# Patient Record
Sex: Female | Born: 1945 | Race: Black or African American | Hispanic: No | Marital: Married | Smoking: Never smoker
Health system: Southern US, Community
[De-identification: ages and names within clinical notes are randomized; demographics above are authoritative.]

## PROBLEM LIST (undated history)

## (undated) DIAGNOSIS — I359 Nonrheumatic aortic valve disorder, unspecified: Secondary | ICD-10-CM

## (undated) DIAGNOSIS — I10 Essential (primary) hypertension: Secondary | ICD-10-CM

## (undated) DIAGNOSIS — D649 Anemia, unspecified: Secondary | ICD-10-CM

## (undated) DIAGNOSIS — E049 Nontoxic goiter, unspecified: Secondary | ICD-10-CM

## (undated) DIAGNOSIS — E785 Hyperlipidemia, unspecified: Secondary | ICD-10-CM

## (undated) DIAGNOSIS — E7800 Pure hypercholesterolemia, unspecified: Secondary | ICD-10-CM

## (undated) HISTORY — DX: Nonrheumatic aortic valve disorder, unspecified: I35.9

## (undated) HISTORY — DX: Hyperlipidemia, unspecified: E78.5

## (undated) HISTORY — DX: Nontoxic goiter, unspecified: E04.9

## (undated) HISTORY — PX: ABDOMINAL HYSTERECTOMY: SHX81

## (undated) HISTORY — DX: Anemia, unspecified: D64.9

## (undated) HISTORY — PX: COLONOSCOPY: SHX174

## (undated) HISTORY — DX: Essential (primary) hypertension: I10

## (undated) HISTORY — PX: OTHER SURGICAL HISTORY: SHX169

---

## 1976-05-30 HISTORY — PX: THYROIDECTOMY, PARTIAL: SHX18

## 2001-02-05 ENCOUNTER — Encounter: Payer: Self-pay | Admitting: Internal Medicine

## 2001-02-05 ENCOUNTER — Ambulatory Visit (HOSPITAL_COMMUNITY): Admission: RE | Admit: 2001-02-05 | Discharge: 2001-02-05 | Payer: Self-pay | Admitting: Internal Medicine

## 2001-08-06 ENCOUNTER — Other Ambulatory Visit: Admission: RE | Admit: 2001-08-06 | Discharge: 2001-08-06 | Payer: Self-pay | Admitting: Obstetrics & Gynecology

## 2001-08-21 ENCOUNTER — Encounter: Payer: Self-pay | Admitting: Internal Medicine

## 2001-08-21 ENCOUNTER — Ambulatory Visit (HOSPITAL_COMMUNITY): Admission: RE | Admit: 2001-08-21 | Discharge: 2001-08-21 | Payer: Self-pay | Admitting: Internal Medicine

## 2001-11-19 ENCOUNTER — Ambulatory Visit (HOSPITAL_COMMUNITY): Admission: RE | Admit: 2001-11-19 | Discharge: 2001-11-19 | Payer: Self-pay | Admitting: General Surgery

## 2002-08-28 ENCOUNTER — Encounter: Payer: Self-pay | Admitting: Internal Medicine

## 2002-08-28 ENCOUNTER — Ambulatory Visit (HOSPITAL_COMMUNITY): Admission: RE | Admit: 2002-08-28 | Discharge: 2002-08-28 | Payer: Self-pay | Admitting: Internal Medicine

## 2004-10-07 ENCOUNTER — Emergency Department (HOSPITAL_COMMUNITY): Admission: EM | Admit: 2004-10-07 | Discharge: 2004-10-07 | Payer: Self-pay | Admitting: Emergency Medicine

## 2006-10-17 ENCOUNTER — Ambulatory Visit (HOSPITAL_COMMUNITY): Admission: RE | Admit: 2006-10-17 | Discharge: 2006-10-17 | Payer: Self-pay | Admitting: General Surgery

## 2007-09-18 ENCOUNTER — Ambulatory Visit (HOSPITAL_COMMUNITY): Admission: RE | Admit: 2007-09-18 | Discharge: 2007-09-18 | Payer: Self-pay | Admitting: Internal Medicine

## 2008-09-19 ENCOUNTER — Ambulatory Visit (HOSPITAL_COMMUNITY): Admission: RE | Admit: 2008-09-19 | Discharge: 2008-09-19 | Payer: Self-pay | Admitting: Internal Medicine

## 2009-09-21 ENCOUNTER — Ambulatory Visit (HOSPITAL_COMMUNITY): Admission: RE | Admit: 2009-09-21 | Discharge: 2009-09-21 | Payer: Self-pay | Admitting: Internal Medicine

## 2010-09-21 ENCOUNTER — Other Ambulatory Visit (HOSPITAL_COMMUNITY): Payer: Self-pay | Admitting: Internal Medicine

## 2010-09-21 DIAGNOSIS — Z139 Encounter for screening, unspecified: Secondary | ICD-10-CM

## 2010-09-24 ENCOUNTER — Ambulatory Visit (HOSPITAL_COMMUNITY)
Admission: RE | Admit: 2010-09-24 | Discharge: 2010-09-24 | Disposition: A | Discharge status: Home / Self Care | Payer: Medicare Other | Source: Ambulatory Visit | Attending: Internal Medicine | Admitting: Internal Medicine

## 2010-09-24 DIAGNOSIS — Z1231 Encounter for screening mammogram for malignant neoplasm of breast: Secondary | ICD-10-CM | POA: Insufficient documentation

## 2010-09-24 DIAGNOSIS — Z139 Encounter for screening, unspecified: Secondary | ICD-10-CM

## 2010-10-15 NOTE — H&P (Signed)
Anna [name], Anna [name]           ACCOUNT NO.:  [id number]   MEDICAL RECORD NO.:  [id number]          PATIENT TYPE:  AMB   LOCATION:                                FACILITY:  [facility]   PHYSICIAN:  Dalia Heading, M.D.  DATE OF BIRTH:  04/08/1946   DATE OF ADMISSION:  DATE OF DISCHARGE:  LH                              HISTORY & PHYSICAL   AGE:  65 years old.   CHIEF COMPLAINT:  Diverticulosis.   HISTORY OF PRESENT ILLNESS:  Patient is a 65 year old black female who  was referred for endoscopic evaluation.  She needs a colonoscopy for  history of diverticulosis.  She denies any abdominal pain, weight loss,  nausea, vomiting, diarrhea, constipation, melena, hematochezia.  She  last had a colonoscopy is 2003.  There is no family history of colon  carcinoma.   PAST MEDICAL HISTORY:  Hypertension.   PAST SURGICAL HISTORY:  As noted above, hysterectomy, thyroidectomy.   CURRENT MEDICATIONS:  Hydrochlorothiazide, baby aspirin which she is  holding, vitamins, glucosamine.   ALLERGIES:  No known drug allergies.   REVIEW OF SYSTEMS:  Noncontributory.   PHYSICAL EXAMINATION:  GENERAL:  Patient is a well-developed, well-  nourished black female in no acute distress.  LUNGS:  Clear to auscultation with equal breath sounds bilaterally.  HEART:  Regular rate and rhythm without S3, S4 or murmurs.  ABDOMEN:  Soft, nontender, nondistended.  No hepatosplenomegaly or  masses noted.  RECTAL:  Deferred to the procedure.   IMPRESSION:  Diverticulosis.   PLAN:  The patient is scheduled for a colonoscopy on 10/17/2006.  The  risks and benefits of the procedure including bleeding and perforation  were fully explained to the patient, gave informed consent.      Dalia Heading, M.D.  Electronically Signed     MAJ/MEDQ  D:  09/21/2006  T:  09/21/2006  Job:  [id number]   cc:   Kingsley Callander. Ouida Sills, MD  Fax: [phone]

## 2011-10-05 ENCOUNTER — Other Ambulatory Visit (HOSPITAL_COMMUNITY): Payer: Self-pay | Admitting: Internal Medicine

## 2011-10-05 DIAGNOSIS — Z139 Encounter for screening, unspecified: Secondary | ICD-10-CM

## 2011-10-10 ENCOUNTER — Ambulatory Visit (HOSPITAL_COMMUNITY)
Admission: RE | Admit: 2011-10-10 | Discharge: 2011-10-10 | Disposition: A | Discharge status: Home / Self Care | Payer: Medicare Other | Source: Ambulatory Visit | Attending: Internal Medicine | Admitting: Internal Medicine

## 2011-10-10 DIAGNOSIS — Z1231 Encounter for screening mammogram for malignant neoplasm of breast: Secondary | ICD-10-CM | POA: Insufficient documentation

## 2011-10-10 DIAGNOSIS — Z139 Encounter for screening, unspecified: Secondary | ICD-10-CM

## 2011-11-15 NOTE — H&P (Signed)
  NTS SOAP Note  Vital Signs:  Vitals as of: 11/15/2011: Systolic 158: Diastolic 82: Heart Rate 62: Temp 97.66F: Height 21ft 2.25in: Weight 200Lbs 0 Ounces: BMI 36  BMI : 36.28 kg/m2  Subjective: This 33 Years 53 Months old Female presents for TCS.  Both father and sister have colon cancer.  Denies any GI complaints.  Last TCS 2008.    Review of Symptoms:  Constitutional:unremarkable   Head:unremarkable    Eyes:unremarkable   Nose/Mouth/Throat:unremarkable Cardiovascular:  unremarkable   Respiratory:unremarkable   Gastrointestinal:  unremarkable   Genitourinary:unremarkable     Musculoskeletal:unremarkable   Skin:unremarkable Hematolgic/Lymphatic:unremarkable     Allergic/Immunologic:unremarkable     Past Medical History:    Reviewed   Past Medical History  Surgical History: thyroidectomy, TCS 2008, [facility] Medical Problems:  Anemia, Heart murmur, High Blood pressure, High cholesterol Allergies: nkda Medications: HCTZ-losartin, vitamens   Social History:Reviewed  Social History  Preferred Language: English ([place]) Ethnicity: Not Hispanic / Latino Age: 27 Years 6 Months Marital Status:  M Alcohol:  No Recreational drug(s):  No   Smoking Status: Never smoker reviewed on 11/15/2011  Family History:  Reviewed   Family History              Father:  Cancer-colon             Sibling:  Cancer-colon in sister    Objective Information:   Obese BF in NAD Eyes:unremarkable   Neck:unremarkable   Heart:  RRR, no murmur appreciated, but heart soound distant. Lungs:    CTA bilaterally, no wheezes, rhonchi, rales.  Breathing unlabored. Abdomen:Soft, NT/ND, no HSM, no masses.   deferred to procedure  Assessment:Family h/o colon cancer  Diagnosis &amp; Procedure: DiagnosisCode: V16.0, ProcedureCode: 16109,    Plan:Scheduled for TCS on 11/22/11.   Patient Education:Alternative treatments  to surgery were discussed with patient (and family).  Risks and benefits  of procedure were fully explained to the patient (and family) who gave informed consent. Patient/family questions were addressed.  Follow-up:Pending Surgery

## 2011-11-16 ENCOUNTER — Encounter (HOSPITAL_COMMUNITY): Payer: Self-pay | Admitting: Pharmacy Technician

## 2011-11-22 ENCOUNTER — Encounter (HOSPITAL_COMMUNITY)
Admission: RE | Disposition: A | Discharge status: Home / Self Care | Payer: Self-pay | Source: Ambulatory Visit | Attending: General Surgery

## 2011-11-22 ENCOUNTER — Ambulatory Visit (HOSPITAL_COMMUNITY)
Admission: RE | Admit: 2011-11-22 | Discharge: 2011-11-22 | Disposition: A | Discharge status: Home / Self Care | Payer: Medicare Other | Source: Ambulatory Visit | Attending: General Surgery | Admitting: General Surgery

## 2011-11-22 ENCOUNTER — Encounter (HOSPITAL_COMMUNITY): Payer: Self-pay | Admitting: *Deleted

## 2011-11-22 DIAGNOSIS — Z79899 Other long term (current) drug therapy: Secondary | ICD-10-CM | POA: Insufficient documentation

## 2011-11-22 DIAGNOSIS — Z800 Family history of malignant neoplasm of digestive organs: Secondary | ICD-10-CM | POA: Insufficient documentation

## 2011-11-22 DIAGNOSIS — I10 Essential (primary) hypertension: Secondary | ICD-10-CM | POA: Insufficient documentation

## 2011-11-22 DIAGNOSIS — Z1211 Encounter for screening for malignant neoplasm of colon: Secondary | ICD-10-CM | POA: Insufficient documentation

## 2011-11-22 DIAGNOSIS — E7800 Pure hypercholesterolemia, unspecified: Secondary | ICD-10-CM | POA: Insufficient documentation

## 2011-11-22 HISTORY — DX: Pure hypercholesterolemia, unspecified: E78.00

## 2011-11-22 HISTORY — PX: COLONOSCOPY: SHX5424

## 2011-11-22 HISTORY — DX: Essential (primary) hypertension: I10

## 2011-11-22 SURGERY — COLONOSCOPY
Anesthesia: Moderate Sedation

## 2011-11-22 MED ORDER — SODIUM CHLORIDE 0.45 % IV SOLN
Freq: Once | INTRAVENOUS | Status: AC
  Administered 2011-11-22: 09:00:00 via INTRAVENOUS

## 2011-11-22 MED ORDER — MEPERIDINE HCL 100 MG/ML IJ SOLN
INTRAMUSCULAR | Status: AC
  Filled 2011-11-22: qty 2

## 2011-11-22 MED ORDER — SIMETHICONE 40 MG/0.6ML PO SUSP
ORAL | Status: DC | PRN
  Administered 2011-11-22: 09:00:00

## 2011-11-22 MED ORDER — MIDAZOLAM HCL 5 MG/5ML IJ SOLN
INTRAMUSCULAR | Status: AC
  Filled 2011-11-22: qty 10

## 2011-11-22 MED ORDER — MEPERIDINE HCL 25 MG/ML IJ SOLN
INTRAMUSCULAR | Status: DC | PRN
  Administered 2011-11-22: 50 mg via INTRAVENOUS

## 2011-11-22 MED ORDER — MIDAZOLAM HCL 5 MG/5ML IJ SOLN
INTRAMUSCULAR | Status: DC | PRN
  Administered 2011-11-22: 1 mg via INTRAVENOUS
  Administered 2011-11-22: 3 mg via INTRAVENOUS

## 2011-11-22 NOTE — Discharge Instructions (Signed)
Colonoscopy Care After Read the instructions outlined below and refer to this sheet in the next few weeks. These discharge instructions provide you with general information on caring for yourself after you leave the hospital. Your doctor may also give you specific instructions. While your treatment has been planned according to the most current medical practices available, unavoidable complications occasionally occur. If you have any problems or questions after discharge, call your doctor. HOME CARE INSTRUCTIONS ACTIVITY:  You may resume your regular activity, but move at a slower pace for the next 24 hours.   Take frequent rest periods for the next 24 hours.   Walking will help get rid of the air and reduce the bloated feeling in your belly (abdomen).   No driving for 24 hours (because of the medicine (anesthesia) used during the test).   You may shower.   Do not sign any important legal documents or operate any machinery for 24 hours (because of the anesthesia used during the test).  NUTRITION:  Drink plenty of fluids.   You may resume your normal diet as instructed by your doctor.   Begin with a light meal and progress to your normal diet. Heavy or fried foods are harder to digest and may make you feel sick to your stomach (nauseated).   Avoid alcoholic beverages for 24 hours or as instructed.  MEDICATIONS:  You may resume your normal medications unless your doctor tells you otherwise.  WHAT TO EXPECT TODAY:  Some feelings of bloating in the abdomen.   Passage of more gas than usual.   Spotting of blood in your stool or on the toilet paper.  IF YOU HAD POLYPS REMOVED DURING THE COLONOSCOPY:  No aspirin products for 7 days or as instructed.   No alcohol for 7 days or as instructed.   Eat a soft diet for the next 24 hours.  FINDING OUT THE RESULTS OF YOUR TEST Not all test results are available during your visit. If your test results are not back during the visit, make an  appointment with your caregiver to find out the results. Do not assume everything is normal if you have not heard from your caregiver or the medical facility. It is important for you to follow up on all of your test results.  SEEK IMMEDIATE MEDICAL CARE IF:  You have more than a spotting of blood in your stool.   Your belly is swollen (abdominal distention).   You are nauseated or vomiting.   You have a fever.   You have abdominal pain or discomfort that is severe or gets worse throughout the day.  Document Released: 12/29/2003 Document Revised: 05/05/2011 Document Reviewed: 12/27/2007 ExitCare Patient Information 2012 [employer]. 

## 2011-11-22 NOTE — Op Note (Signed)
[facility] [address] [place], [place]  [address]  COLONOSCOPY PROCEDURE REPORT  PATIENT:  Anna [name], Anna [name]  MR#:  [id number] BIRTHDATE:  05/19/1946, 65 yrs. old  GENDER:  female ENDOSCOPIST:  Franky Macho, MD REF. BY:  Carylon Perches, M.D. PROCEDURE DATE:  11/22/2011 PROCEDURE:  Higher-risk screening colonoscopy G0105  ASA CLASS:  Class II INDICATIONS:  family history of colon cancer MEDICATIONS:   Versed 4 mg IV, demerol 50 mg IV  DESCRIPTION OF PROCEDURE:   After the risks benefits and alternatives of the procedure were thoroughly explained, informed consent was obtained.  Digital rectal exam was performed and revealed no abnormalities.   The EC-[id number] ([id number]) endoscope was introduced through the anus and advanced to the cecum, which was identified by both the appendix and ileocecal valve, without limitations.  The quality of the prep was adequate..  The instrument was then slowly withdrawn as the colon was fully examined.  FINDINGS:  A normal appearing cecum, ileocecal valve, and appendiceal orifice were identified. The ascending, hepatic flexure, transverse, splenic flexure, descending, sigmoid colon, and rectum appeared unremarkable.   Retroflexed views in the rectum revealed no abnormalities.  The scope was then withdrawn from the cecum and the procedure completed. COMPLICATIONS:  None ENDOSCOPIC IMPRESSION: 1) Normal colon RECOMMENDATIONS:  REPEAT EXAM:  In 5 year(s) for Colonoscopy.  ______________________________ Franky Macho, MD  CC:  Carylon Perches, MD  n. Rosalie DoctorFranky Macho at 11/22/2011 09:26 AM  Randolm Idol, [phone]

## 2011-11-22 NOTE — Interval H&P Note (Signed)
History and Physical Interval Note:  11/22/2011 8:53 AM  Anna [name]  has presented today for surgery, with the diagnosis of Family Hx Colon Ca  The various methods of treatment have been discussed with the patient and family. After consideration of risks, benefits and other options for treatment, the patient has consented to  Procedure(s) (LRB): COLONOSCOPY (N/A) as a surgical intervention .  The patient's history has been reviewed, patient examined, no change in status, stable for surgery.  I have reviewed the patients' chart and labs.  Questions were answered to the patient's satisfaction.     Franky Macho A

## 2011-11-25 ENCOUNTER — Encounter (HOSPITAL_COMMUNITY): Payer: Self-pay | Admitting: General Surgery

## 2012-01-25 ENCOUNTER — Emergency Department (HOSPITAL_COMMUNITY)
Admission: EM | Admit: 2012-01-25 | Discharge: 2012-01-25 | Disposition: A | Discharge status: Home / Self Care | Payer: Medicare Other | Attending: Emergency Medicine | Admitting: Emergency Medicine

## 2012-01-25 ENCOUNTER — Encounter (HOSPITAL_COMMUNITY): Payer: Self-pay

## 2012-01-25 DIAGNOSIS — Y93B9 Activity, other involving muscle strengthening exercises: Secondary | ICD-10-CM | POA: Insufficient documentation

## 2012-01-25 DIAGNOSIS — Y998 Other external cause status: Secondary | ICD-10-CM | POA: Insufficient documentation

## 2012-01-25 DIAGNOSIS — S0101XA Laceration without foreign body of scalp, initial encounter: Secondary | ICD-10-CM

## 2012-01-25 DIAGNOSIS — W208XXA Other cause of strike by thrown, projected or falling object, initial encounter: Secondary | ICD-10-CM | POA: Insufficient documentation

## 2012-01-25 DIAGNOSIS — S0100XA Unspecified open wound of scalp, initial encounter: Secondary | ICD-10-CM | POA: Insufficient documentation

## 2012-01-25 DIAGNOSIS — E7800 Pure hypercholesterolemia, unspecified: Secondary | ICD-10-CM | POA: Insufficient documentation

## 2012-01-25 DIAGNOSIS — I10 Essential (primary) hypertension: Secondary | ICD-10-CM | POA: Insufficient documentation

## 2012-01-25 NOTE — ED Notes (Signed)
Pt ambulated to the bathroom.  

## 2012-01-25 NOTE — ED Notes (Signed)
Pt was working out with a punching bag and the bag fell from the ceiling.  The steel plate fell from the ceiling and hit pt in head.  No LOC.  Has hematoma to back of head and laceration.  EMS dressed but wound still ooozing.

## 2012-01-25 NOTE — ED Provider Notes (Signed)
History  This chart was scribed for Raeford Razor, MD by Shari Heritage. The patient was seen in room APA04/APA04. Patient's care was started at 1020.     CSN: [id number]  Arrival date & time 01/25/12  1020   First MD Initiated Contact with Patient 01/25/12 1031      Chief Complaint  Patient presents with  . Laceration    Patient is a 66 y.o. female presenting with skin laceration. The history is provided by the patient. No language interpreter was used.  Laceration  The incident occurred 1 to 2 hours ago. The laceration is located on the scalp. The laceration is 1 cm in size. The laceration mechanism was a a metal edge. She reports no foreign bodies present.    Anna [name] is a 66 y.o. female who presents to the Emergency Department complaining of a laceration to the left parietal region of her head onset 1-2 hours ago. Patient was struck on the back of the head with a piece of metal that fell from the ceiling while she was exercising. She denies LOC, but others told her she stumbled and fell after she was hit. No back pain. No visual disturbance. No numbness, tingling or weakness. Patient takes aspirin 81 mg daily. Patient's medical history includes HTN and hypercholesteremia. She has a surgical history of colonoscopy and abdominal hysterectomy.  Patient was transported to the ED by EMS.    Past Medical History  Diagnosis Date  . Hypertension   . Hypercholesteremia     Past Surgical History  Procedure Date  . Abdominal hysterectomy   . Goiter removed   . Colonoscopy   . Colonoscopy 11/22/2011    Procedure: COLONOSCOPY;  Surgeon: Dalia Heading, MD;  Location: [facility];  Service: Gastroenterology;  Laterality: N/A;    Family History  Problem Relation Age of Onset  . Colon cancer Father   . Colon cancer Sister     History  Substance Use Topics  . Smoking status: Never Smoker   . Smokeless tobacco: Not on file  . Alcohol Use: No    OB History    Grav Para  Term Preterm Abortions TAB SAB Ect Mult Living                  Review of Systems  Eyes: Negative for visual disturbance.  Musculoskeletal: Negative for back pain.  Skin: Positive for wound.  Neurological: Negative for syncope, weakness and numbness.  All other systems reviewed and are negative.    Allergies  Review of patient's allergies indicates no known allergies.  Home Medications   Current Outpatient Rx  Name Route Sig Dispense Refill  . VITAMIN D3 1000 UNITS PO CAPS Oral Take 1 capsule by mouth daily.    Marland Kitchen LOSARTAN POTASSIUM-HCTZ 100-25 MG PO TABS Oral Take 1 tablet by mouth daily.    . ONE-A-DAY ENERGY PO Oral Take by mouth.    Marland Kitchen NAPROXEN SODIUM 220 MG PO CAPS Oral Take 1 capsule by mouth every 8 (eight) hours as needed. Pain    . PRENATAL MULTIVITAMIN CH Oral Take 1 tablet by mouth daily.    Marland Kitchen PROPYLENE GLYCOL 0.6 % OP SOLN Ophthalmic Apply 1 drop to eye 3 (three) times daily.    . RED YEAST RICE 600 MG PO CAPS Oral Take 1 capsule by mouth daily.      BP 142/71  Pulse 78  Temp 98 F (36.7 C) (Oral)  Resp 18  Ht 5' 3.25" (1.607  m)  Wt 200 lb (90.719 kg)  BMI 35.15 kg/m2  SpO2 98%  Physical Exam  Nursing note and vitals reviewed. Constitutional: She is oriented to person, place, and time. She appears well-developed and well-nourished. No distress.  HENT:  Head: Normocephalic. Head is with laceration.       1 CM laceration to left parietal region with underlying hematoma. No active bleeding.  Eyes: Conjunctivae are normal. Right eye exhibits no discharge. Left eye exhibits no discharge.  Neck: Neck supple.  Cardiovascular: Normal rate, regular rhythm and normal heart sounds.  Exam reveals no gallop and no friction rub.   No murmur heard. Pulmonary/Chest: Effort normal and breath sounds normal. No respiratory distress.  Abdominal: Soft. She exhibits no distension. There is no tenderness.  Musculoskeletal: She exhibits no edema and no tenderness.    Neurological: She is alert and oriented to person, place, and time. No cranial nerve deficit or sensory deficit.       Cranial nerves intact. Strength is normal. Finger to nose intact.  Skin: Skin is warm and dry.  Psychiatric: She has a normal mood and affect. Her behavior is normal. Thought content normal.    ED Course  Procedures (including critical care time) DIAGNOSTIC STUDIES: Oxygen Saturation is 98% on room air, normal by my interpretation.    COORDINATION OF CARE: 10:32am- Patient understands and agrees with initial ED impression and plan with expectations set for ED visit.  LACERATION REPAIR PROCEDURE NOTE The patient's identification was confirmed and consent was obtained. This procedure was performed by Raeford Razor, MD at 10:58 AM. Site: left parietal region Anesthetic used (type and amt): Offered, but none used. Staple type/size: Stainless steel  Length:1 cm # of Staples: 1 Technique: Stapled  Complexity: Simple Site anesthetized, irrigated with NS, explored without evidence of foreign body, wound well approximated, site covered with dry, sterile dressing.  Patient tolerated procedure well without complications. Instructions for care discussed verbally and patient provided with additional written instructions for homecare and f/u.      Labs Reviewed - No data to display  No results found.   1. Scalp laceration       MDM  66 year old female for small scalp laceration. This was closed. Patient has a nonfocal neurological examination. No midline spinal tenderness. Neuroimaging was considered, but deferred without a clear indication. Continued wound care and head injury instructions were discussed. Outpatient followup as needed and for staple removal.    I personally preformed the services scribed in my presence. The recorded information has been reviewed and considered. Raeford Razor, MD.    Raeford Razor, MD 02/01/12 [phone]

## 2012-04-19 ENCOUNTER — Other Ambulatory Visit (HOSPITAL_COMMUNITY): Payer: Self-pay | Admitting: Obstetrics & Gynecology

## 2012-04-19 DIAGNOSIS — E049 Nontoxic goiter, unspecified: Secondary | ICD-10-CM

## 2012-04-24 ENCOUNTER — Ambulatory Visit (HOSPITAL_COMMUNITY)
Admission: RE | Admit: 2012-04-24 | Discharge: 2012-04-24 | Disposition: A | Discharge status: Home / Self Care | Payer: Medicare Other | Source: Ambulatory Visit | Attending: Obstetrics & Gynecology | Admitting: Obstetrics & Gynecology

## 2012-04-24 DIAGNOSIS — E049 Nontoxic goiter, unspecified: Secondary | ICD-10-CM

## 2012-10-01 ENCOUNTER — Other Ambulatory Visit (HOSPITAL_COMMUNITY): Payer: Self-pay | Admitting: Internal Medicine

## 2012-10-01 DIAGNOSIS — Z139 Encounter for screening, unspecified: Secondary | ICD-10-CM

## 2012-10-11 ENCOUNTER — Ambulatory Visit (HOSPITAL_COMMUNITY)
Admission: RE | Admit: 2012-10-11 | Discharge: 2012-10-11 | Disposition: A | Discharge status: Home / Self Care | Payer: PRIVATE HEALTH INSURANCE | Source: Ambulatory Visit | Attending: Internal Medicine | Admitting: Internal Medicine

## 2012-10-11 DIAGNOSIS — Z139 Encounter for screening, unspecified: Secondary | ICD-10-CM

## 2012-10-11 DIAGNOSIS — Z1231 Encounter for screening mammogram for malignant neoplasm of breast: Secondary | ICD-10-CM | POA: Insufficient documentation

## 2012-11-08 ENCOUNTER — Telehealth (HOSPITAL_COMMUNITY): Payer: Self-pay | Admitting: Dietician

## 2012-11-08 NOTE — Telephone Encounter (Signed)
Received referral via fax from Dr. Ouida Sills for dx: DM, HTN, and hyperlipdemia.

## 2012-11-08 NOTE — Telephone Encounter (Signed)
Called pt at 1545. Appointment scheduled for 11/03/12 at 1400.

## 2012-12-03 ENCOUNTER — Encounter (HOSPITAL_COMMUNITY): Payer: Self-pay | Admitting: Dietician

## 2012-12-03 DIAGNOSIS — E119 Type 2 diabetes mellitus without complications: Secondary | ICD-10-CM | POA: Insufficient documentation

## 2012-12-03 DIAGNOSIS — I10 Essential (primary) hypertension: Secondary | ICD-10-CM | POA: Insufficient documentation

## 2012-12-03 DIAGNOSIS — D649 Anemia, unspecified: Secondary | ICD-10-CM | POA: Insufficient documentation

## 2012-12-03 DIAGNOSIS — E785 Hyperlipidemia, unspecified: Secondary | ICD-10-CM | POA: Insufficient documentation

## 2012-12-03 DIAGNOSIS — E049 Nontoxic goiter, unspecified: Secondary | ICD-10-CM | POA: Insufficient documentation

## 2012-12-03 DIAGNOSIS — I359 Nonrheumatic aortic valve disorder, unspecified: Secondary | ICD-10-CM | POA: Insufficient documentation

## 2012-12-03 NOTE — Progress Notes (Signed)
Outpatient Initial Nutrition Assessment Date:12/03/2012   Appt Start Time: 1347  Referring Physician: Dr. Ouida Sills Reason for Visit: HTN, hyperlipidemia  Nutrition Assessment:  Height: 5' 2.25" (158.1 cm)   Weight: 197 lb (89.359 kg)   IBW: 111# %IBW: 137# UBW: 200# %UBW: 100% Body mass index is 35.75 kg/(m^2). Classified as obesity, class II. Goal Weight: 177# (10% loss of current body weight) Weight hx: Pt reports within the past 5 years she has gained approximately 30-34# ( she reports lowest weight in the past 5 years was 170# and her highest weight was 204#).   Estimated nutritional needs: 1300-1400 kcals daily, 72-90 grams protein daily, 1.3-1.4 L fluid daily  PMH:  Past Medical History  Diagnosis Date  . Hypertension   . Hypercholesteremia   . Anemia   . Aortic valve disorder   . Diabetes   . Hyperlipidemia   . HTN (hypertension)   . Nodular goiter     Medications:  Current Outpatient Rx  Name  Route  Sig  Dispense  Refill  . Cholecalciferol (VITAMIN D3) 1000 UNITS CAPS   Oral   Take 1 capsule by mouth daily.         Marland Kitchen losartan-hydrochlorothiazide (HYZAAR) 100-25 MG per tablet   Oral   Take 1 tablet by mouth daily.         . Multiple Vitamins-Minerals (ONE-A-DAY ENERGY PO)   Oral   Take 1 tablet by mouth daily.          . naproxen sodium (ALEVE) 220 MG tablet   Oral   Take 220 mg by mouth 2 (two) times daily as needed. Pain         . pneumococcal 23 valent vaccine (PNEUMOVAX 23) 25 MCG/0.5ML injection   Intramuscular   Inject 0.5 mLs into the muscle once.         . Prenatal Vit-Fe Fumarate-FA (PRENATAL MULTIVITAMIN) TABS   Oral   Take 1 tablet by mouth daily.         Marland Kitchen Propylene Glycol (SYSTANE BALANCE) 0.6 % SOLN   Both Eyes   Place 1 drop into both eyes 3 (three) times daily.          . Red Yeast Rice 600 MG CAPS   Oral   Take 2 capsules by mouth daily.          Marland Kitchen Zoster Vaccine Live (ZOSTAVAX [place])   Subcutaneous   Inject into the  skin.           Labs: CMP  No results found for this basename: na, k, cl, co2, glucose, bun, creatinine, calcium, prot, albumin, ast, alt, alkphos, bilitot, gfrnonaa, gfraa    Lipid Panel  No results found for this basename: chol, trig, hdl, cholhdl, vldl, ldlcalc     No results found for this basename: HGBA1C   No results found for this basename: GLUF, MICROALBUR, LDLCALC, CREATININE    Most recent labs (11/02/12): Na: 134, K: 4.0, Co2: 98, Cl: 30, BUN: 9, Creat: 1.00, Glucose: 118, Total Cholesterol: 195, Triglycerides: 71, HDL: 41, LDL: 140  Lifestyle/ social habits: Ms. Gambino lives in [place] with her husband of 47 years. They have no children. She retired in 2008; she was formerly a Writer at [employer]. She reports her stress level as a 6/10, citing finances and family as her main sources of stress. She reports that she has been attending a Silver Sneakers Class at a [employer] two times per week (classes are 60 minutes). She also  reports that she will walk 3 times per week. She tells me that despite being on vacation a few weeks ago, she was able to walk 3 miles per day, that she tracked with her pedometer.   Nutrition hx/habits: Ms. Straub reports no recent changes in her diet. She reports frustration that "all the foods I like to eat are bad; even the healthy ones". She reports that she is frustrated with conflicting nutrition information in the media. She reports that she has started drinking water, sugar free drink mixes, and diet sodas, but "I think they're bad for me because of the aspartame". SHe is very motivated about her health and would like to lose weight. She tells me that she has attended several wellness seminars at the [facility] about healthy eating, exercise, and other healthy lifestyle topics. She does not have a set eating schedule. She does not check her blood sugars; she reports she is "only prediabetic" and does not have a prescription for a meter or  strips.  Diet recall: Breakfast (8-10 AM): Ensure OR green Bolt house smoothie; Lunch: peanut butter and jelly sandwich on wholel wheat bread OR fish (fried tilapia or grilled salon) and sweet potato OR baked meat, broccoli, and squash; Dinner: eggs, toast, [place] bacon, [place] sausage OR egg white omelet with bell peppers OR meat, starch, and vegetable  Nutrition Diagnosis: Excessive cholestreol intake r/t diet recall AEB LDL: 140.   Nutrition Intervention: Nutrition rx: 1200 kcal NAS, heart healthy diet; 3 meals per day, no snacks; baked, boiled, grilled, or broiled proteins most often; low calorie beverages only; 30 minutes physical activity daily  Education/Counseling Provided: Educated pt on principles of diabetic diet and weight management. Discussed carbohydrate metabolism in relation to diabetes.. Educated pt on plate method, portion sizes, and sources of carbohydrate. Discussed importance of regular meal pattern. Discussed importance of adding sources of whole grains to diet to improve glycemic control. Also encouraged to choose low fat dairy, lean meats, and whole fruits and vegetables more often. Discussed options of artificial sweeteners and encouraged pt to use which brand she liked best. Reviewed patient's dietary recall. Provided examples on ways to decrease sodium and fat intake in diet. Discouraged intake of processed foods and use of salt shaker. Encouraged fresh fruits and vegetables as well as whole grain sources of carbohydrates to maximize fiber intake. Discussed nutritional content of foods commonly eaten and discussed healthier alternatives. Discussed importance of compliance to prevent further complications of disease. Educated pt on importance of physical activity (goal of at least 30 minutes 5 times per week) along with a healthy diet to achieve weight loss and glycemic goals. Encouraged slow, moderate weight loss (0.5-2# weight loss per week or 7-10% of current body weight) and  adopting healthy lifestyle changes vs. obtaining a certain body type or weight. Encouraged weighing self weekly at a consistent day and time of choic . Provided "Go With The Whole Grains" and "Destination: Heart Healthy Eating". Also provided AND Nutrition Care Manual's "Heart Heart Healthy Nutrition Therapy" handout. Used TeachBack to assess understanding.   Understanding, Motivation, Ability to Follow Recommendations: Expect fair to good compliance.   Monitoring and Evaluation: Goals: 1) 1-2# weight loss per week; 2) 30 minutes physical activity daily  Recommendations: 1) For weight loss: No less than 1200 kcals daily; 2) Use measuring cups to ensure proper portions; 3) Share entrees at restaurants; 4) Low calorie beverages; 5) Be as active as possible   F/U: PRN. Pt did not desire a follow-up  appointment at this time. Provided RD contact information.   Melody Haver, RD, LDN 12/03/2012  Appt EndTime: 1535

## 2013-10-23 ENCOUNTER — Other Ambulatory Visit (HOSPITAL_COMMUNITY): Payer: Self-pay | Admitting: Internal Medicine

## 2013-10-23 DIAGNOSIS — Z139 Encounter for screening, unspecified: Secondary | ICD-10-CM

## 2013-10-24 ENCOUNTER — Ambulatory Visit (HOSPITAL_COMMUNITY)
Admission: RE | Admit: 2013-10-24 | Discharge: 2013-10-24 | Disposition: A | Discharge status: Home / Self Care | Payer: Medicare HMO | Source: Ambulatory Visit | Attending: Internal Medicine | Admitting: Internal Medicine

## 2013-10-24 DIAGNOSIS — Z803 Family history of malignant neoplasm of breast: Secondary | ICD-10-CM | POA: Insufficient documentation

## 2013-10-24 DIAGNOSIS — Z1231 Encounter for screening mammogram for malignant neoplasm of breast: Secondary | ICD-10-CM | POA: Insufficient documentation

## 2013-10-24 DIAGNOSIS — Z139 Encounter for screening, unspecified: Secondary | ICD-10-CM

## 2013-11-21 ENCOUNTER — Ambulatory Visit (INDEPENDENT_AMBULATORY_CARE_PROVIDER_SITE_OTHER): Payer: Medicare HMO | Admitting: Obstetrics & Gynecology

## 2013-11-21 ENCOUNTER — Encounter: Payer: Self-pay | Admitting: Obstetrics & Gynecology

## 2013-11-21 VITALS — BP 150/70 | Ht 62.4 in | Wt 192.0 lb

## 2013-11-21 DIAGNOSIS — Z01419 Encounter for gynecological examination (general) (routine) without abnormal findings: Secondary | ICD-10-CM

## 2013-11-21 DIAGNOSIS — Z1212 Encounter for screening for malignant neoplasm of rectum: Secondary | ICD-10-CM

## 2013-11-21 NOTE — Progress Notes (Signed)
Patient ID: Anna [name], female   DOB: Jul 01, 1945, 68 y.o.   MRN: [id number] Subjective:     Anna [name] is a 68 y.o. female here for a routine exam.  No LMP recorded. Patient has had a hysterectomy. No obstetric history on file. Birth Control Method:  na Menstrual Calendar(currently): na  Current complaints: none.   Current acute medical issues:  na   Recent Gynecologic History No LMP recorded. Patient has had a hysterectomy. Last Pap: na,   Last mammogram: 2015,  normal  Past Medical History  Diagnosis Date  . Hypertension   . Hypercholesteremia   . Anemia   . Aortic valve disorder   . Diabetes   . Hyperlipidemia   . HTN (hypertension)   . Nodular goiter     Past Surgical History  Procedure Laterality Date  . Abdominal hysterectomy    . Goiter removed    . Colonoscopy    . Colonoscopy  11/22/2011    Procedure: COLONOSCOPY;  Surgeon: Dalia HeadingMark A Jenkins, MD;  Location: [facility];  Service: Gastroenterology;  Laterality: N/A;    OB History   Grav Para Term Preterm Abortions TAB SAB Ect Mult Living                  History   Social History  . Marital Status: Married    Spouse Name: N/A    Number of Children: N/A  . Years of Education: N/A   Social History Main Topics  . Smoking status: Never Smoker   . Smokeless tobacco: None  . Alcohol Use: No  . Drug Use: No  . Sexual Activity: None   Other Topics Concern  . None   Social History Narrative  . None    Family History  Problem Relation Age of Onset  . Colon cancer Father   . Colon cancer Sister      Review of Systems  Review of Systems  Constitutional: Negative for fever, chills, weight loss, malaise/fatigue and diaphoresis.  HENT: Negative for hearing loss, ear pain, nosebleeds, congestion, sore throat, neck pain, tinnitus and ear discharge.   Eyes: Negative for blurred vision, double vision, photophobia, pain, discharge and redness.  Respiratory: Negative for cough, hemoptysis,  sputum production, shortness of breath, wheezing and stridor.   Cardiovascular: Negative for chest pain, palpitations, orthopnea, claudication, leg swelling and PND.  Gastrointestinal: negative for abdominal pain. Negative for heartburn, nausea, vomiting, diarrhea, constipation, blood in stool and melena.  Genitourinary: Negative for dysuria, urgency, frequency, hematuria and flank pain.  Musculoskeletal: Negative for myalgias, back pain, joint pain and [name].  Skin: Negative for itching and rash.  Neurological: Negative for dizziness, tingling, tremors, sensory change, speech change, focal weakness, seizures, loss of consciousness, weakness and headaches.  Endo/Heme/Allergies: Negative for environmental allergies and polydipsia. Does not bruise/bleed easily.  Psychiatric/Behavioral: Negative for depression, suicidal ideas, hallucinations, memory loss and substance abuse. The patient is not nervous/anxious and does not have insomnia.        Objective:    Physical Exam  Vitals reviewed. Constitutional: She is oriented to person, place, and time. She appears well-developed and well-nourished.  HENT:  Head: Normocephalic and atraumatic.        Right Ear: External ear normal.  Left Ear: External ear normal.  Nose: Nose normal.  Mouth/Throat: Oropharynx is clear and moist.  Eyes: Conjunctivae and EOM are normal. Pupils are equal, round, and reactive to light. Right eye exhibits no discharge. Left eye exhibits no discharge. No  scleral icterus.  Neck: Normal range of motion. Neck supple. No tracheal deviation present. No thyromegaly present.  Cardiovascular: Normal rate, regular rhythm, normal heart sounds and intact distal pulses.  Exam reveals no gallop and no friction rub.   No murmur heard. Respiratory: Effort normal and breath sounds normal. No respiratory distress. She has no wheezes. She has no rales. She exhibits no tenderness.  GI: Soft. Bowel sounds are normal. She exhibits no  distension and no mass. There is no tenderness. There is no rebound and no guarding.  Genitourinary:  Breasts no masses skin changes or nipple changes bilaterally      Vulva is normal without lesions Vagina is pink moist without discharge Cervix absent Uterus is absent Adnexa is negative Rectal    hemoccult negative, normal tone, no masses  Musculoskeletal: Normal range of motion. She exhibits no edema and no tenderness.  Neurological: She is alert and oriented to person, place, and time. She has normal reflexes. She displays normal reflexes. No cranial nerve deficit. She exhibits normal muscle tone. Coordination normal.  Skin: Skin is warm and dry. No rash noted. No erythema. No pallor.  Psychiatric: She has a normal mood and affect. Her behavior is normal. Judgment and thought content normal.       Assessment:    Healthy female exam.    Plan:    Follow up in: 2 years.

## 2014-10-10 ENCOUNTER — Other Ambulatory Visit (HOSPITAL_COMMUNITY): Payer: Self-pay | Admitting: Internal Medicine

## 2014-10-10 DIAGNOSIS — Z1231 Encounter for screening mammogram for malignant neoplasm of breast: Secondary | ICD-10-CM

## 2014-11-05 ENCOUNTER — Ambulatory Visit (HOSPITAL_COMMUNITY)
Admission: RE | Admit: 2014-11-05 | Discharge: 2014-11-05 | Disposition: A | Discharge status: Home / Self Care | Payer: PPO | Source: Ambulatory Visit | Attending: Internal Medicine | Admitting: Internal Medicine

## 2014-11-05 DIAGNOSIS — Z1231 Encounter for screening mammogram for malignant neoplasm of breast: Secondary | ICD-10-CM | POA: Diagnosis not present

## 2015-07-20 DIAGNOSIS — I10 Essential (primary) hypertension: Secondary | ICD-10-CM | POA: Diagnosis not present

## 2015-07-20 DIAGNOSIS — E119 Type 2 diabetes mellitus without complications: Secondary | ICD-10-CM | POA: Diagnosis not present

## 2015-07-20 DIAGNOSIS — Z79899 Other long term (current) drug therapy: Secondary | ICD-10-CM | POA: Diagnosis not present

## 2015-07-20 DIAGNOSIS — E785 Hyperlipidemia, unspecified: Secondary | ICD-10-CM | POA: Diagnosis not present

## 2015-07-27 DIAGNOSIS — E1122 Type 2 diabetes mellitus with diabetic chronic kidney disease: Secondary | ICD-10-CM | POA: Diagnosis not present

## 2015-07-27 DIAGNOSIS — E1129 Type 2 diabetes mellitus with other diabetic kidney complication: Secondary | ICD-10-CM | POA: Diagnosis not present

## 2015-07-27 DIAGNOSIS — Z6832 Body mass index (BMI) 32.0-32.9, adult: Secondary | ICD-10-CM | POA: Diagnosis not present

## 2015-07-27 DIAGNOSIS — I10 Essential (primary) hypertension: Secondary | ICD-10-CM | POA: Diagnosis not present

## 2015-08-10 DIAGNOSIS — H40013 Open angle with borderline findings, low risk, bilateral: Secondary | ICD-10-CM | POA: Diagnosis not present

## 2015-08-10 DIAGNOSIS — H18893 Other specified disorders of cornea, bilateral: Secondary | ICD-10-CM | POA: Diagnosis not present

## 2015-08-10 DIAGNOSIS — H40003 Preglaucoma, unspecified, bilateral: Secondary | ICD-10-CM | POA: Diagnosis not present

## 2015-08-10 DIAGNOSIS — H25013 Cortical age-related cataract, bilateral: Secondary | ICD-10-CM | POA: Diagnosis not present

## 2015-08-17 DIAGNOSIS — J189 Pneumonia, unspecified organism: Secondary | ICD-10-CM | POA: Diagnosis not present

## 2015-09-24 DIAGNOSIS — H40003 Preglaucoma, unspecified, bilateral: Secondary | ICD-10-CM | POA: Diagnosis not present

## 2015-09-24 DIAGNOSIS — H18893 Other specified disorders of cornea, bilateral: Secondary | ICD-10-CM | POA: Diagnosis not present

## 2015-09-25 ENCOUNTER — Other Ambulatory Visit (HOSPITAL_COMMUNITY): Payer: Self-pay | Admitting: Internal Medicine

## 2015-09-25 DIAGNOSIS — Z1231 Encounter for screening mammogram for malignant neoplasm of breast: Secondary | ICD-10-CM

## 2015-10-05 ENCOUNTER — Ambulatory Visit (INDEPENDENT_AMBULATORY_CARE_PROVIDER_SITE_OTHER): Payer: PPO | Admitting: Obstetrics & Gynecology

## 2015-10-05 ENCOUNTER — Encounter: Payer: Self-pay | Admitting: Obstetrics & Gynecology

## 2015-10-05 VITALS — BP 140/70 | HR 78 | Ht 61.4 in | Wt 171.0 lb

## 2015-10-05 DIAGNOSIS — Z1212 Encounter for screening for malignant neoplasm of rectum: Secondary | ICD-10-CM

## 2015-10-05 DIAGNOSIS — Z01419 Encounter for gynecological examination (general) (routine) without abnormal findings: Secondary | ICD-10-CM

## 2015-10-05 DIAGNOSIS — Z124 Encounter for screening for malignant neoplasm of cervix: Secondary | ICD-10-CM | POA: Diagnosis not present

## 2015-10-05 DIAGNOSIS — Z1211 Encounter for screening for malignant neoplasm of colon: Secondary | ICD-10-CM

## 2015-10-05 NOTE — Progress Notes (Signed)
Patient ID: Anna [name], female   DOB: 13-Oct-1945, 70 y.o.   MRN: [id number] Subjective:     Anna [name] is a 70 y.o. female here for a routine exam.  No LMP recorded. Patient has had a hysterectomy. No obstetric history on file. Birth Control Method:  Post menopausal Menstrual Calendar(currently): regular  Current complaints: none.   Current acute medical issues:  No acute   Recent Gynecologic History No LMP recorded. Patient has had a hysterectomy. Last Pap: na,   Last mammogram: 2016 ,  normal  Past Medical History  Diagnosis Date  . Hypertension   . Hypercholesteremia   . Anemia   . Aortic valve disorder   . Diabetes ([facility])   . Hyperlipidemia   . HTN (hypertension)   . Nodular goiter     Past Surgical History  Procedure Laterality Date  . Abdominal hysterectomy    . Goiter removed    . Colonoscopy    . Colonoscopy  11/22/2011    Procedure: COLONOSCOPY;  Surgeon: Dalia HeadingMark A Jenkins, MD;  Location: [facility];  Service: Gastroenterology;  Laterality: N/A;    OB History    No data available      Social History   Social History  . Marital Status: Married    Spouse Name: N/A  . Number of Children: N/A  . Years of Education: N/A   Social History Main Topics  . Smoking status: Never Smoker   . Smokeless tobacco: None  . Alcohol Use: No  . Drug Use: No  . Sexual Activity: Not Asked   Other Topics Concern  . None   Social History Narrative    Family History  Problem Relation Age of Onset  . Colon cancer Father   . Colon cancer Sister      Current outpatient prescriptions:  .  aspirin 81 MG tablet, Take 81 mg by mouth daily., Disp: , Rfl:  .  Cholecalciferol (VITAMIN D3) 1000 UNITS CAPS, Take 1 capsule by mouth daily., Disp: , Rfl:  .  losartan-hydrochlorothiazide (HYZAAR) 100-25 MG per tablet, Take 1 tablet by mouth daily., Disp: , Rfl:  .  Multiple Vitamins-Minerals (ONE-A-DAY ENERGY PO), Take 1 tablet by mouth daily. , Disp: , Rfl:  .   Propylene Glycol (SYSTANE BALANCE) 0.6 % SOLN, Place 1 drop into both eyes 3 (three) times daily. , Disp: , Rfl:  .  Red Yeast Rice 600 MG CAPS, Take 2 capsules by mouth daily. , Disp: , Rfl:   Review of Systems  Review of Systems  Constitutional: Negative for fever, chills, weight loss, malaise/fatigue and diaphoresis.  HENT: Negative for hearing loss, ear pain, nosebleeds, congestion, sore throat, neck pain, tinnitus and ear discharge.   Eyes: Negative for blurred vision, double vision, photophobia, pain, discharge and redness.  Respiratory: Negative for cough, hemoptysis, sputum production, shortness of breath, wheezing and stridor.   Cardiovascular: Negative for chest pain, palpitations, orthopnea, claudication, leg swelling and PND.  Gastrointestinal: negative for abdominal pain. Negative for heartburn, nausea, vomiting, diarrhea, constipation, blood in stool and melena.  Genitourinary: Negative for dysuria, urgency, frequency, hematuria and flank pain.  Musculoskeletal: Negative for myalgias, back pain, joint pain and falls.  Skin: Negative for itching and rash.  Neurological: Negative for dizziness, tingling, tremors, sensory change, speech change, focal weakness, seizures, loss of consciousness, weakness and headaches.  Endo/Heme/Allergies: Negative for environmental allergies and polydipsia. Does not bruise/bleed easily.  Psychiatric/Behavioral: Negative for depression, suicidal ideas, hallucinations, memory loss and substance abuse.  The patient is not nervous/anxious and does not have insomnia.        Objective:  Blood pressure 140/70, pulse 78, height 5' 1.4" (1.56 m), weight 171 lb (77.565 kg).   Physical Exam  Vitals reviewed. Constitutional: She is oriented to person, place, and time. She appears well-developed and well-nourished.  HENT:  Head: Normocephalic and atraumatic.        Right Ear: External ear normal.  Left Ear: External ear normal.  Nose: Nose normal.   Mouth/Throat: thyroid enlarged non tender, stable Eyes: Conjunctivae and EOM are normal. Pupils are equal, round, and reactive to light. Right eye exhibits no discharge. Left eye exhibits no discharge. No scleral icterus.  Neck: Normal range of motion. Neck supple. No tracheal deviation present. No thyromegaly present.  Cardiovascular: Normal rate, regular rhythm, normal heart sounds and intact distal pulses.  Exam reveals no gallop and no friction rub.   No murmur heard. Respiratory: Effort normal and breath sounds normal. No respiratory distress. She has no wheezes. She has no rales. She exhibits no tenderness.  GI: Soft. Bowel sounds are normal. She exhibits no distension and no mass. There is no tenderness. There is no rebound and no guarding.  Genitourinary:  Breasts no masses skin changes or nipple changes bilaterally      Vulva is normal without lesions Vagina is pink moist without discharge Cervix absent Uterus is absent Adnexa is negative  {Rectal    hemoccult negative, normal tone, no masses  Musculoskeletal: Normal range of motion. She exhibits no edema and no tenderness.  Neurological: She is alert and oriented to person, place, and time. She has normal reflexes. She displays normal reflexes. No cranial nerve deficit. She exhibits normal muscle tone. Coordination normal.  Skin: Skin is warm and dry. No rash noted. No erythema. No pallor.  Psychiatric: She has a normal mood and affect. Her behavior is normal. Judgment and thought content normal.       Medications Ordered at today's visit: Meds ordered this encounter  Medications  . aspirin 81 MG tablet    Sig: Take 81 mg by mouth daily.    Other orders placed at today's visit: No orders of the defined types were placed in this encounter.      Assessment:    Healthy female exam.    Plan:    Mammogram ordered. Follow up in: 1 year.

## 2015-10-07 ENCOUNTER — Ambulatory Visit (HOSPITAL_COMMUNITY): Payer: PPO

## 2015-11-09 ENCOUNTER — Ambulatory Visit (HOSPITAL_COMMUNITY)
Admission: RE | Admit: 2015-11-09 | Discharge: 2015-11-09 | Disposition: A | Discharge status: Home / Self Care | Payer: PPO | Source: Ambulatory Visit | Attending: Internal Medicine | Admitting: Internal Medicine

## 2015-11-09 DIAGNOSIS — Z1231 Encounter for screening mammogram for malignant neoplasm of breast: Secondary | ICD-10-CM | POA: Diagnosis not present

## 2015-11-23 DIAGNOSIS — E785 Hyperlipidemia, unspecified: Secondary | ICD-10-CM | POA: Diagnosis not present

## 2015-11-23 DIAGNOSIS — D649 Anemia, unspecified: Secondary | ICD-10-CM | POA: Diagnosis not present

## 2015-11-23 DIAGNOSIS — E049 Nontoxic goiter, unspecified: Secondary | ICD-10-CM | POA: Diagnosis not present

## 2015-11-23 DIAGNOSIS — I10 Essential (primary) hypertension: Secondary | ICD-10-CM | POA: Diagnosis not present

## 2015-11-23 DIAGNOSIS — E039 Hypothyroidism, unspecified: Secondary | ICD-10-CM | POA: Diagnosis not present

## 2015-11-23 DIAGNOSIS — Z79899 Other long term (current) drug therapy: Secondary | ICD-10-CM | POA: Diagnosis not present

## 2015-11-23 DIAGNOSIS — E119 Type 2 diabetes mellitus without complications: Secondary | ICD-10-CM | POA: Diagnosis not present

## 2015-11-30 DIAGNOSIS — Z6832 Body mass index (BMI) 32.0-32.9, adult: Secondary | ICD-10-CM | POA: Diagnosis not present

## 2015-11-30 DIAGNOSIS — E1129 Type 2 diabetes mellitus with other diabetic kidney complication: Secondary | ICD-10-CM | POA: Diagnosis not present

## 2015-11-30 DIAGNOSIS — N183 Chronic kidney disease, stage 3 (moderate): Secondary | ICD-10-CM | POA: Diagnosis not present

## 2015-11-30 DIAGNOSIS — Z0001 Encounter for general adult medical examination with abnormal findings: Secondary | ICD-10-CM | POA: Diagnosis not present

## 2016-04-01 DIAGNOSIS — E119 Type 2 diabetes mellitus without complications: Secondary | ICD-10-CM | POA: Diagnosis not present

## 2016-04-01 DIAGNOSIS — Z79899 Other long term (current) drug therapy: Secondary | ICD-10-CM | POA: Diagnosis not present

## 2016-04-08 DIAGNOSIS — N183 Chronic kidney disease, stage 3 (moderate): Secondary | ICD-10-CM | POA: Diagnosis not present

## 2016-04-08 DIAGNOSIS — Z23 Encounter for immunization: Secondary | ICD-10-CM | POA: Diagnosis not present

## 2016-04-08 DIAGNOSIS — I10 Essential (primary) hypertension: Secondary | ICD-10-CM | POA: Diagnosis not present

## 2016-04-08 DIAGNOSIS — E1122 Type 2 diabetes mellitus with diabetic chronic kidney disease: Secondary | ICD-10-CM | POA: Diagnosis not present

## 2016-04-19 DIAGNOSIS — M7502 Adhesive capsulitis of left shoulder: Secondary | ICD-10-CM | POA: Diagnosis not present

## 2016-04-19 DIAGNOSIS — M7582 Other shoulder lesions, left shoulder: Secondary | ICD-10-CM | POA: Diagnosis not present

## 2016-08-05 DIAGNOSIS — Z79899 Other long term (current) drug therapy: Secondary | ICD-10-CM | POA: Diagnosis not present

## 2016-08-05 DIAGNOSIS — I10 Essential (primary) hypertension: Secondary | ICD-10-CM | POA: Diagnosis not present

## 2016-08-05 DIAGNOSIS — E785 Hyperlipidemia, unspecified: Secondary | ICD-10-CM | POA: Diagnosis not present

## 2016-08-05 DIAGNOSIS — E049 Nontoxic goiter, unspecified: Secondary | ICD-10-CM | POA: Diagnosis not present

## 2016-08-05 DIAGNOSIS — E119 Type 2 diabetes mellitus without complications: Secondary | ICD-10-CM | POA: Diagnosis not present

## 2016-08-12 DIAGNOSIS — Z6832 Body mass index (BMI) 32.0-32.9, adult: Secondary | ICD-10-CM | POA: Diagnosis not present

## 2016-08-12 DIAGNOSIS — I10 Essential (primary) hypertension: Secondary | ICD-10-CM | POA: Diagnosis not present

## 2016-08-12 DIAGNOSIS — E1122 Type 2 diabetes mellitus with diabetic chronic kidney disease: Secondary | ICD-10-CM | POA: Diagnosis not present

## 2016-08-12 DIAGNOSIS — N183 Chronic kidney disease, stage 3 (moderate): Secondary | ICD-10-CM | POA: Diagnosis not present

## 2016-10-27 ENCOUNTER — Other Ambulatory Visit (HOSPITAL_COMMUNITY): Payer: Self-pay | Admitting: Internal Medicine

## 2016-10-27 DIAGNOSIS — Z1231 Encounter for screening mammogram for malignant neoplasm of breast: Secondary | ICD-10-CM

## 2016-11-02 DIAGNOSIS — M65341 Trigger finger, right ring finger: Secondary | ICD-10-CM | POA: Diagnosis not present

## 2016-11-07 ENCOUNTER — Encounter: Payer: Self-pay | Admitting: Obstetrics & Gynecology

## 2016-11-07 ENCOUNTER — Ambulatory Visit (INDEPENDENT_AMBULATORY_CARE_PROVIDER_SITE_OTHER): Payer: PPO | Admitting: Obstetrics & Gynecology

## 2016-11-07 VITALS — BP 160/82 | HR 64 | Ht 62.0 in | Wt 179.0 lb

## 2016-11-07 DIAGNOSIS — Z01419 Encounter for gynecological examination (general) (routine) without abnormal findings: Secondary | ICD-10-CM

## 2016-11-07 DIAGNOSIS — Z1212 Encounter for screening for malignant neoplasm of rectum: Secondary | ICD-10-CM | POA: Diagnosis not present

## 2016-11-07 DIAGNOSIS — Z1211 Encounter for screening for malignant neoplasm of colon: Secondary | ICD-10-CM | POA: Diagnosis not present

## 2016-11-07 DIAGNOSIS — I358 Other nonrheumatic aortic valve disorders: Secondary | ICD-10-CM

## 2016-11-07 LAB — HEMOCCULT GUIAC POC 1CARD (OFFICE): Fecal Occult Blood, POC: POSITIVE — AB

## 2016-11-07 NOTE — Progress Notes (Signed)
Subjective:     Anna [name] is a 71 y.o. female here for a routine exam.  No LMP recorded. Patient has had a hysterectomy. [id number] Birth Control Method:  hysterectomy Menstrual Calendar(currently):   Current complaints: occasional bleeding with BM, pt thinks from hemorrhoids.   Current acute medical issues:  none   Recent Gynecologic History No LMP recorded. Patient has had a hysterectomy. Last Pap: n/a Last mammogram: 11/09/2015,  normal  Past Medical History:  Diagnosis Date  . Anemia   . Aortic valve disorder   . Diabetes ([place])   . HTN (hypertension)   . Hypercholesteremia   . Hyperlipidemia   . Hypertension   . Nodular goiter     Past Surgical History:  Procedure Laterality Date  . ABDOMINAL HYSTERECTOMY    . COLONOSCOPY    . COLONOSCOPY  11/22/2011   Procedure: COLONOSCOPY;  Surgeon: Jamesetta So, MD;  Location: [facility];  Service: Gastroenterology;  Laterality: N/A;  . Goiter removed      OB History    Gravida Para Term Preterm AB Living   0 0 0 0 0 0   SAB TAB Ectopic Multiple Live Births   0 0 0 0 0      Social History   Social History  . Marital status: Married    Spouse name: N/A  . Number of children: N/A  . Years of education: N/A   Social History Main Topics  . Smoking status: Never Smoker  . Smokeless tobacco: Never Used  . Alcohol use No  . Drug use: No  . Sexual activity: Yes   Other Topics Concern  . None   Social History Narrative  . None    Family History  Problem Relation Age of Onset  . Colon cancer Father   . Colon cancer Sister      Current Outpatient Prescriptions:  .  aspirin 81 MG tablet, Take 81 mg by mouth daily., Disp: , Rfl:  .  Cholecalciferol (VITAMIN D3) 1000 UNITS CAPS, Take 1 capsule by mouth daily., Disp: , Rfl:  .  Cyanocobalamin (B-12 PO), Take 1 tablet by mouth daily., Disp: , Rfl:  .  Krill Oil Omega-3 500 MG CAPS, Take 500 mg by mouth daily., Disp: , Rfl:  .  losartan-hydrochlorothiazide  (HYZAAR) 100-25 MG per tablet, Take 1 tablet by mouth daily., Disp: , Rfl:  .  Multiple Vitamins-Minerals (ONE-A-DAY ENERGY PO), Take 1 tablet by mouth daily. , Disp: , Rfl:  .  Propylene Glycol (SYSTANE BALANCE) 0.6 % SOLN, Place 1 drop into both eyes 3 (three) times daily. , Disp: , Rfl:  .  Red Yeast Rice 600 MG CAPS, Take 2 capsules by mouth daily. , Disp: , Rfl:   Review of Systems  Review of Systems  Constitutional: Negative for fever, chills, weight loss, malaise/fatigue and diaphoresis.  HENT: Negative for hearing loss, ear pain, nosebleeds, congestion, sore throat, neck pain, tinnitus and ear discharge.   Eyes: Negative for blurred vision, double vision, photophobia, pain, discharge and redness.  Respiratory: Negative for cough, hemoptysis, sputum production, shortness of breath, wheezing and stridor.   Cardiovascular: Negative for chest pain, palpitations, orthopnea, claudication, leg swelling and PND.  Gastrointestinal: negative for abdominal pain. Negative for heartburn, nausea, vomiting, diarrhea, constipation, blood in stool and melena.  Genitourinary: Negative for dysuria, urgency, frequency, hematuria and flank pain.  Musculoskeletal: Negative for myalgias, back pain, joint pain and falls.  Skin: Negative for itching and rash.  Neurological: Negative  for dizziness, tingling, tremors, sensory change, speech change, focal weakness, seizures, loss of consciousness, weakness and headaches.  Endo/Heme/Allergies: Negative for environmental allergies and polydipsia. Does not bruise/bleed easily.  Psychiatric/Behavioral: Negative for depression, suicidal ideas, hallucinations, memory loss and substance abuse. The patient is not nervous/anxious and does not have insomnia.        Objective:  Blood pressure (!) 160/82, pulse 64, height '5\' 2"'  (1.575 m), weight 179 lb (81.2 kg).   Physical Exam  Vitals reviewed. Constitutional: She is oriented to person, place, and time. She appears  well-developed and well-nourished.  HENT:  Head: Normocephalic and atraumatic.        Right Ear: External ear normal.  Left Ear: External ear normal.  Nose: Nose normal.  Mouth/Throat: Oropharynx is clear and moist.  Eyes: Conjunctivae and EOM are normal. Pupils are equal, round, and reactive to light. Right eye exhibits no discharge. Left eye exhibits no discharge. No scleral icterus.  Neck: Normal range of motion. Neck supple. No tracheal deviation present. No thyromegaly present.  Cardiovascular: Normal rate, regular rhythm, normal heart sounds and intact distal pulses.  Exam reveals no gallop and no friction rub.   Brde II/VI SEM has history of aortic valve issues Respiratory: Effort normal and breath sounds normal. No respiratory distress. She has no wheezes. She has no rales. She exhibits no tenderness.  GI: Soft. Bowel sounds are normal. She exhibits no distension and no mass. There is no tenderness. There is no rebound and no guarding.  Genitourinary:  Breasts no masses skin changes or nipple changes bilaterally      Vulva is normal without lesions Vagina is pink moist without discharge Cervix absent Uterus is normal size shape and contour Adnexa is negative  {Rectal    hemoccult positive, normal tone, no masses + hemorrhoids, have been flaring lately Musculoskeletal: Normal range of motion. She exhibits no edema and no tenderness.  Neurological: She is alert and oriented to person, place, and time. She has normal reflexes. She displays normal reflexes. No cranial nerve deficit. She exhibits normal muscle tone. Coordination normal.  Skin: Skin is warm and dry. No rash noted. No erythema. No pallor.  Psychiatric: She has a normal mood and affect. Her behavior is normal. Judgment and thought content normal.       Medications Ordered at today's visit: Meds ordered this encounter  Medications  . Cyanocobalamin (B-12 PO)    Sig: Take 1 tablet by mouth daily.  Javier Docker Oil Omega-3  500 MG CAPS    Sig: Take 500 mg by mouth daily.    Other orders placed at today's visit: No orders of the defined types were placed in this encounter.     Assessment:    Healthy female exam.   Hemoccult +: will send home with 3 card kit, last colonoscopy 10/2011, to have colonoscopy this year Plan:    Mammogram ordered. Follow up in: 1 year. hemoccult kit for home, seeing Dr Willey Blade in 1 month and will discuss valve again     Return in about 2 years (around 11/08/2018) for yearly, with Dr Elonda Husky.

## 2016-11-07 NOTE — Addendum Note (Signed)
Addended by: Tish FredericksonLANCASTER, Alanta Scobey A on: 11/07/2016 04:51 PM   Modules accepted: Orders

## 2016-11-10 ENCOUNTER — Ambulatory Visit (HOSPITAL_COMMUNITY)
Admission: RE | Admit: 2016-11-10 | Discharge: 2016-11-10 | Disposition: A | Discharge status: Home / Self Care | Payer: PPO | Source: Ambulatory Visit | Attending: Internal Medicine | Admitting: Internal Medicine

## 2016-11-10 DIAGNOSIS — Z1231 Encounter for screening mammogram for malignant neoplasm of breast: Secondary | ICD-10-CM | POA: Insufficient documentation

## 2016-11-17 ENCOUNTER — Other Ambulatory Visit (INDEPENDENT_AMBULATORY_CARE_PROVIDER_SITE_OTHER): Payer: PPO

## 2016-11-17 DIAGNOSIS — Z1211 Encounter for screening for malignant neoplasm of colon: Secondary | ICD-10-CM | POA: Diagnosis not present

## 2016-11-17 LAB — HEMOCCULT GUIAC POC 1CARD (OFFICE)
Card #3 Fecal Occult Blood, POC: NEGATIVE
FECAL OCCULT BLD: NEGATIVE
FECAL OCCULT BLD: NEGATIVE

## 2016-11-29 DIAGNOSIS — E785 Hyperlipidemia, unspecified: Secondary | ICD-10-CM | POA: Diagnosis not present

## 2016-11-29 DIAGNOSIS — I10 Essential (primary) hypertension: Secondary | ICD-10-CM | POA: Diagnosis not present

## 2016-11-29 DIAGNOSIS — N183 Chronic kidney disease, stage 3 (moderate): Secondary | ICD-10-CM | POA: Diagnosis not present

## 2016-11-29 DIAGNOSIS — E1129 Type 2 diabetes mellitus with other diabetic kidney complication: Secondary | ICD-10-CM | POA: Diagnosis not present

## 2016-11-29 DIAGNOSIS — E049 Nontoxic goiter, unspecified: Secondary | ICD-10-CM | POA: Diagnosis not present

## 2016-11-29 DIAGNOSIS — D649 Anemia, unspecified: Secondary | ICD-10-CM | POA: Diagnosis not present

## 2016-12-08 DIAGNOSIS — N183 Chronic kidney disease, stage 3 (moderate): Secondary | ICD-10-CM | POA: Diagnosis not present

## 2016-12-08 DIAGNOSIS — R0602 Shortness of breath: Secondary | ICD-10-CM | POA: Diagnosis not present

## 2016-12-08 DIAGNOSIS — Z6831 Body mass index (BMI) 31.0-31.9, adult: Secondary | ICD-10-CM | POA: Diagnosis not present

## 2016-12-08 DIAGNOSIS — Z0001 Encounter for general adult medical examination with abnormal findings: Secondary | ICD-10-CM | POA: Diagnosis not present

## 2016-12-08 DIAGNOSIS — E1129 Type 2 diabetes mellitus with other diabetic kidney complication: Secondary | ICD-10-CM | POA: Diagnosis not present

## 2016-12-14 DIAGNOSIS — M65341 Trigger finger, right ring finger: Secondary | ICD-10-CM | POA: Insufficient documentation

## 2016-12-15 ENCOUNTER — Ambulatory Visit (HOSPITAL_COMMUNITY)
Admission: RE | Admit: 2016-12-15 | Discharge: 2016-12-15 | Disposition: A | Discharge status: Home / Self Care | Payer: PPO | Source: Ambulatory Visit | Attending: Internal Medicine | Admitting: Internal Medicine

## 2016-12-15 ENCOUNTER — Other Ambulatory Visit (HOSPITAL_COMMUNITY): Payer: Self-pay | Admitting: Internal Medicine

## 2016-12-15 DIAGNOSIS — I700 Atherosclerosis of aorta: Secondary | ICD-10-CM | POA: Diagnosis not present

## 2016-12-15 DIAGNOSIS — I517 Cardiomegaly: Secondary | ICD-10-CM | POA: Diagnosis not present

## 2016-12-15 DIAGNOSIS — R0602 Shortness of breath: Secondary | ICD-10-CM | POA: Diagnosis not present

## 2016-12-15 LAB — ECHOCARDIOGRAM COMPLETE
AOPV: 0.87 m/s
AV Area VTI index: 1.33 cm2/m2
AV Area VTI: 2.46 cm2
AV Area mean vel: 2.49 cm2
AV Mean grad: 7 mmHg
AV Peak grad: 15 mmHg
AV VEL mean LVOT/AV: 0.88
AV peak Index: 1.28
AV pk vel: 195 cm/s
AV vel: 2.56
AVAREAMEANVIN: 1.3 cm2/m2
CHL CUP AV VALUE AREA INDEX: 1.33
CHL CUP DOP CALC LVOT VTI: 37.8 cm
CHL CUP RV SYS PRESS: 26 mmHg
CHL CUP TV REG PEAK VELOCITY: 242 cm/s
DOP CAL AO MEAN VELOCITY: 123 cm/s
E/e' ratio: 9.83
EWDT: 320 ms
FS: 54 % — AB (ref 28–44)
IVS/LV PW RATIO, ED: 1.01
LA diam index: 1.82 cm/m2
LA vol index: 25.9 mL/m2
LASIZE: 35 mm
LAVOL: 49.7 mL
LAVOLA4C: 61.7 mL
LEFT ATRIUM END SYS DIAM: 35 mm
LV E/e' medial: 9.83
LV E/e'average: 9.83
LV dias vol index: 36 mL/m2
LV sys vol: 16 mL
LVDIAVOL: 69 mL (ref 46–106)
LVELAT: 8.16 cm/s
LVOT area: 2.84 cm2
LVOT diameter: 19 mm
LVOT peak grad rest: 11 mmHg
LVOT peak vel: 169 cm/s
LVOTSV: 107 mL
LVOTVTI: 0.9 cm
LVSYSVOLIN: 8 mL/m2
Lateral S' vel: 16.4 cm/s
MV Dec: 320
MV Peak grad: 3 mmHg
MV pk E vel: 80.2 m/s
MVPKAVEL: 93.9 m/s
PW: 11.6 mm — AB (ref 0.6–1.1)
RV TAPSE: 27.7 mm
Simpson's disk: 77
Stroke v: 53 ml
TDI e' lateral: 8.16
TDI e' medial: 5
TR max vel: 242 cm/s
VTI: 41.9 cm
Valve area: 2.56 cm2

## 2016-12-15 NOTE — Progress Notes (Signed)
*  PRELIMINARY RESULTS* Echocardiogram 2D Echocardiogram has been performed.  Stacey DrainWhite, Ling Flesch J 12/15/2016, 1:51 PM

## 2017-02-01 ENCOUNTER — Other Ambulatory Visit (HOSPITAL_COMMUNITY): Payer: Self-pay | Admitting: Internal Medicine

## 2017-02-01 ENCOUNTER — Ambulatory Visit (HOSPITAL_COMMUNITY)
Admission: RE | Admit: 2017-02-01 | Discharge: 2017-02-01 | Disposition: A | Discharge status: Home / Self Care | Payer: PPO | Source: Ambulatory Visit | Attending: Internal Medicine | Admitting: Internal Medicine

## 2017-02-01 DIAGNOSIS — M545 Low back pain: Secondary | ICD-10-CM | POA: Diagnosis not present

## 2017-02-01 DIAGNOSIS — I700 Atherosclerosis of aorta: Secondary | ICD-10-CM | POA: Insufficient documentation

## 2017-02-01 DIAGNOSIS — M5136 Other intervertebral disc degeneration, lumbar region: Secondary | ICD-10-CM | POA: Diagnosis not present

## 2017-02-08 ENCOUNTER — Other Ambulatory Visit (HOSPITAL_COMMUNITY): Payer: Self-pay | Admitting: Internal Medicine

## 2017-02-08 DIAGNOSIS — R202 Paresthesia of skin: Secondary | ICD-10-CM

## 2017-02-08 DIAGNOSIS — M51369 Other intervertebral disc degeneration, lumbar region without mention of lumbar back pain or lower extremity pain: Secondary | ICD-10-CM

## 2017-02-08 DIAGNOSIS — M79604 Pain in right leg: Secondary | ICD-10-CM

## 2017-02-08 DIAGNOSIS — M79605 Pain in left leg: Secondary | ICD-10-CM

## 2017-02-08 DIAGNOSIS — M5136 Other intervertebral disc degeneration, lumbar region: Secondary | ICD-10-CM

## 2017-02-14 ENCOUNTER — Ambulatory Visit (HOSPITAL_COMMUNITY)
Admission: RE | Admit: 2017-02-14 | Discharge: 2017-02-14 | Disposition: A | Discharge status: Home / Self Care | Payer: PPO | Source: Ambulatory Visit | Attending: Internal Medicine | Admitting: Internal Medicine

## 2017-02-14 DIAGNOSIS — M5136 Other intervertebral disc degeneration, lumbar region: Secondary | ICD-10-CM | POA: Diagnosis present

## 2017-02-14 DIAGNOSIS — R202 Paresthesia of skin: Secondary | ICD-10-CM

## 2017-02-14 DIAGNOSIS — M79604 Pain in right leg: Secondary | ICD-10-CM

## 2017-02-14 DIAGNOSIS — M4807 Spinal stenosis, lumbosacral region: Secondary | ICD-10-CM | POA: Insufficient documentation

## 2017-02-14 DIAGNOSIS — M545 Low back pain: Secondary | ICD-10-CM | POA: Diagnosis not present

## 2017-02-14 DIAGNOSIS — M48061 Spinal stenosis, lumbar region without neurogenic claudication: Secondary | ICD-10-CM | POA: Diagnosis not present

## 2017-02-14 DIAGNOSIS — M79605 Pain in left leg: Secondary | ICD-10-CM

## 2017-03-16 DIAGNOSIS — M48062 Spinal stenosis, lumbar region with neurogenic claudication: Secondary | ICD-10-CM | POA: Diagnosis not present

## 2017-03-27 DIAGNOSIS — M48062 Spinal stenosis, lumbar region with neurogenic claudication: Secondary | ICD-10-CM | POA: Diagnosis not present

## 2017-04-14 DIAGNOSIS — I10 Essential (primary) hypertension: Secondary | ICD-10-CM | POA: Diagnosis not present

## 2017-04-14 DIAGNOSIS — M48062 Spinal stenosis, lumbar region with neurogenic claudication: Secondary | ICD-10-CM | POA: Diagnosis not present

## 2017-04-14 DIAGNOSIS — Z6833 Body mass index (BMI) 33.0-33.9, adult: Secondary | ICD-10-CM | POA: Diagnosis not present

## 2017-05-04 DIAGNOSIS — Z79899 Other long term (current) drug therapy: Secondary | ICD-10-CM | POA: Diagnosis not present

## 2017-05-04 DIAGNOSIS — N183 Chronic kidney disease, stage 3 (moderate): Secondary | ICD-10-CM | POA: Diagnosis not present

## 2017-05-04 DIAGNOSIS — E1129 Type 2 diabetes mellitus with other diabetic kidney complication: Secondary | ICD-10-CM | POA: Diagnosis not present

## 2017-05-09 DIAGNOSIS — N183 Chronic kidney disease, stage 3 (moderate): Secondary | ICD-10-CM | POA: Diagnosis not present

## 2017-05-09 DIAGNOSIS — I10 Essential (primary) hypertension: Secondary | ICD-10-CM | POA: Diagnosis not present

## 2017-05-09 DIAGNOSIS — E1129 Type 2 diabetes mellitus with other diabetic kidney complication: Secondary | ICD-10-CM | POA: Diagnosis not present

## 2017-06-08 DIAGNOSIS — M48062 Spinal stenosis, lumbar region with neurogenic claudication: Secondary | ICD-10-CM | POA: Diagnosis not present

## 2017-07-24 DIAGNOSIS — R05 Cough: Secondary | ICD-10-CM | POA: Diagnosis not present

## 2017-07-24 DIAGNOSIS — Z6832 Body mass index (BMI) 32.0-32.9, adult: Secondary | ICD-10-CM | POA: Diagnosis not present

## 2017-08-31 DIAGNOSIS — Z6832 Body mass index (BMI) 32.0-32.9, adult: Secondary | ICD-10-CM | POA: Diagnosis not present

## 2017-08-31 DIAGNOSIS — M48062 Spinal stenosis, lumbar region with neurogenic claudication: Secondary | ICD-10-CM | POA: Diagnosis not present

## 2017-08-31 DIAGNOSIS — I10 Essential (primary) hypertension: Secondary | ICD-10-CM | POA: Diagnosis not present

## 2017-09-14 DIAGNOSIS — E1129 Type 2 diabetes mellitus with other diabetic kidney complication: Secondary | ICD-10-CM | POA: Diagnosis not present

## 2017-09-21 DIAGNOSIS — I10 Essential (primary) hypertension: Secondary | ICD-10-CM | POA: Diagnosis not present

## 2017-09-21 DIAGNOSIS — N183 Chronic kidney disease, stage 3 (moderate): Secondary | ICD-10-CM | POA: Diagnosis not present

## 2017-09-21 DIAGNOSIS — E1129 Type 2 diabetes mellitus with other diabetic kidney complication: Secondary | ICD-10-CM | POA: Diagnosis not present

## 2017-11-07 ENCOUNTER — Other Ambulatory Visit (HOSPITAL_COMMUNITY): Payer: Self-pay | Admitting: Internal Medicine

## 2017-11-07 DIAGNOSIS — Z1231 Encounter for screening mammogram for malignant neoplasm of breast: Secondary | ICD-10-CM

## 2017-11-16 ENCOUNTER — Ambulatory Visit (HOSPITAL_COMMUNITY)
Admission: RE | Admit: 2017-11-16 | Discharge: 2017-11-16 | Disposition: A | Discharge status: Home / Self Care | Payer: PPO | Source: Ambulatory Visit | Attending: Internal Medicine | Admitting: Internal Medicine

## 2017-11-16 DIAGNOSIS — Z1231 Encounter for screening mammogram for malignant neoplasm of breast: Secondary | ICD-10-CM | POA: Insufficient documentation

## 2017-12-06 DIAGNOSIS — M48062 Spinal stenosis, lumbar region with neurogenic claudication: Secondary | ICD-10-CM | POA: Diagnosis not present

## 2017-12-06 DIAGNOSIS — I10 Essential (primary) hypertension: Secondary | ICD-10-CM | POA: Diagnosis not present

## 2017-12-06 DIAGNOSIS — Z6834 Body mass index (BMI) 34.0-34.9, adult: Secondary | ICD-10-CM | POA: Diagnosis not present

## 2017-12-21 LAB — GLUCOSE, POCT (MANUAL RESULT ENTRY): POC GLUCOSE: 121 mg/dL — AB (ref 70–99)

## 2018-01-04 DIAGNOSIS — N183 Chronic kidney disease, stage 3 (moderate): Secondary | ICD-10-CM | POA: Diagnosis not present

## 2018-01-04 DIAGNOSIS — E1129 Type 2 diabetes mellitus with other diabetic kidney complication: Secondary | ICD-10-CM | POA: Diagnosis not present

## 2018-01-04 DIAGNOSIS — I10 Essential (primary) hypertension: Secondary | ICD-10-CM | POA: Diagnosis not present

## 2018-01-04 DIAGNOSIS — Z79899 Other long term (current) drug therapy: Secondary | ICD-10-CM | POA: Diagnosis not present

## 2018-01-04 DIAGNOSIS — E049 Nontoxic goiter, unspecified: Secondary | ICD-10-CM | POA: Diagnosis not present

## 2018-01-04 DIAGNOSIS — I358 Other nonrheumatic aortic valve disorders: Secondary | ICD-10-CM | POA: Diagnosis not present

## 2018-01-04 DIAGNOSIS — E119 Type 2 diabetes mellitus without complications: Secondary | ICD-10-CM | POA: Diagnosis not present

## 2018-01-11 DIAGNOSIS — E042 Nontoxic multinodular goiter: Secondary | ICD-10-CM | POA: Diagnosis not present

## 2018-01-11 DIAGNOSIS — E1122 Type 2 diabetes mellitus with diabetic chronic kidney disease: Secondary | ICD-10-CM | POA: Diagnosis not present

## 2018-01-11 DIAGNOSIS — I517 Cardiomegaly: Secondary | ICD-10-CM | POA: Diagnosis not present

## 2018-01-11 DIAGNOSIS — N183 Chronic kidney disease, stage 3 (moderate): Secondary | ICD-10-CM | POA: Diagnosis not present

## 2018-01-11 DIAGNOSIS — Z0001 Encounter for general adult medical examination with abnormal findings: Secondary | ICD-10-CM | POA: Diagnosis not present

## 2018-01-11 DIAGNOSIS — I10 Essential (primary) hypertension: Secondary | ICD-10-CM | POA: Diagnosis not present

## 2018-01-11 DIAGNOSIS — Z6833 Body mass index (BMI) 33.0-33.9, adult: Secondary | ICD-10-CM | POA: Diagnosis not present

## 2018-02-08 ENCOUNTER — Ambulatory Visit: Payer: PPO | Admitting: General Surgery

## 2018-02-15 ENCOUNTER — Ambulatory Visit (INDEPENDENT_AMBULATORY_CARE_PROVIDER_SITE_OTHER): Payer: PPO | Admitting: General Surgery

## 2018-02-15 ENCOUNTER — Encounter: Payer: Self-pay | Admitting: General Surgery

## 2018-02-15 VITALS — BP 183/78 | HR 76 | Temp 98.0°F | Resp 20 | Wt 186.0 lb

## 2018-02-15 DIAGNOSIS — Z1211 Encounter for screening for malignant neoplasm of colon: Secondary | ICD-10-CM

## 2018-02-15 MED ORDER — PEG 3350-KCL-NABCB-NACL-NASULF 236 G PO SOLR: 4000.0000 mL | Freq: Once | ORAL | 0 refills | Status: AC

## 2018-02-15 NOTE — Progress Notes (Signed)
Anna [name]; [id number]; August 09, 1945   HPI Patient is a 72 year old black female who was referred to my care by Dr. Ouida Sills for a screening colonoscopy.  She last had a colonoscopy 6 years ago.  She has a strong immediate family history of colon cancer in a sister and brother.  She denies abdominal pain, nausea, weight loss, blood per rectum.  She currently has 0 out of 10 abdominal pain. Past Medical History:  Diagnosis Date  . Anemia   . Aortic valve disorder   . Diabetes ([facility])   . HTN (hypertension)   . Hypercholesteremia   . Hyperlipidemia   . Hypertension   . Nodular goiter     Past Surgical History:  Procedure Laterality Date  . ABDOMINAL HYSTERECTOMY    . COLONOSCOPY    . COLONOSCOPY  11/22/2011   Procedure: COLONOSCOPY;  Surgeon: Dalia Heading, MD;  Location: [facility];  Service: Gastroenterology;  Laterality: N/A;  . Goiter removed      Family History  Problem Relation Age of Onset  . Colon cancer Father   . Colon cancer Sister     Current Outpatient Medications on File Prior to Visit  Medication Sig Dispense Refill  . gabapentin (NEURONTIN) 300 MG capsule Take 300 mg by mouth 2 (two) times daily.    Marland Kitchen aspirin 81 MG tablet Take 81 mg by mouth daily.    . Cholecalciferol (VITAMIN D3) 1000 UNITS CAPS Take 1 capsule by mouth daily.    . Cyanocobalamin (B-12 PO) Take 1 tablet by mouth daily.    Boris Lown Oil Omega-3 500 MG CAPS Take 500 mg by mouth daily.    Marland Kitchen losartan-hydrochlorothiazide (HYZAAR) 100-25 MG per tablet Take 1 tablet by mouth daily.    . Multiple Vitamins-Minerals (ONE-A-DAY ENERGY PO) Take 1 tablet by mouth daily.     Marland Kitchen Propylene Glycol (SYSTANE BALANCE) 0.6 % SOLN Place 1 drop into both eyes 3 (three) times daily.     . Red Yeast Rice 600 MG CAPS Take 2 capsules by mouth daily.      No current facility-administered medications on file prior to visit.     No Known Allergies  Social History   Substance and Sexual Activity  Alcohol Use No     Social History   Tobacco Use  Smoking Status Never Smoker  Smokeless Tobacco Never Used    Review of Systems  Constitutional: Negative.   HENT: Negative.   Eyes: Negative.   Respiratory: Positive for wheezing.   Cardiovascular: Negative.   Gastrointestinal: Negative.   Genitourinary: Negative.   Musculoskeletal: Positive for back pain and joint pain.  Skin: Negative.   Neurological: Negative.   Endo/Heme/Allergies: Negative.   Psychiatric/Behavioral: Negative.     Objective   Vitals:   02/15/18 1003  BP: (!) 183/78  Pulse: 76  Resp: 20  Temp: 98 F (36.7 C)    Physical Exam  Constitutional: She is oriented to person, place, and time. She appears well-developed and well-nourished. No distress.  HENT:  Head: Normocephalic and atraumatic.  Cardiovascular: Normal rate, regular rhythm and normal heart sounds. Exam reveals no gallop and no friction rub.  No murmur heard. Pulmonary/Chest: Effort normal and breath sounds normal. No stridor. No respiratory distress. She has no wheezes. She has no rales.  Abdominal: Soft. Bowel sounds are normal. She exhibits no distension and no mass. There is no tenderness. There is no guarding.  Neurological: She is alert and oriented to person, place, and time.  Skin: Skin is warm and dry.  Vitals reviewed. Dr. Alonza SmokerFagan's notes reviewed  Assessment  Need for screening colonoscopy, family history of colon cancer Plan   Patient is scheduled for a colonoscopy on 02/27/2018.  The risks and benefits of the procedure including bleeding and perforation were fully explained to who gave informed consent.  GoLYTELY prep has been prescribed.

## 2018-02-15 NOTE — Patient Instructions (Signed)
Colonoscopy, Adult A colonoscopy is an exam to look at the entire large intestine. During the exam, a lubricated, bendable tube is inserted into the anus and then passed into the rectum, colon, and other parts of the large intestine. A colonoscopy is often done as a part of normal colorectal screening or in response to certain symptoms, such as anemia, persistent diarrhea, abdominal pain, and blood in the stool. The exam can help screen for and diagnose medical problems, including:  Tumors.  Polyps.  Inflammation.  Areas of bleeding.  Tell a health care provider about:  Any allergies you have.  All medicines you are taking, including vitamins, herbs, eye drops, creams, and over-the-counter medicines.  Any problems you or family members have had with anesthetic medicines.  Any blood disorders you have.  Any surgeries you have had.  Any medical conditions you have.  Any problems you have had passing stool. What are the risks? Generally, this is a safe procedure. However, problems may occur, including:  Bleeding.  A tear in the intestine.  A reaction to medicines given during the exam.  Infection (rare).  What happens before the procedure? Eating and drinking restrictions Follow instructions from your health care provider about eating and drinking, which may include:  A few days before the procedure - follow a low-fiber diet. Avoid nuts, seeds, dried fruit, raw fruits, and vegetables.  1-3 days before the procedure - follow a clear liquid diet. Drink only clear liquids, such as clear broth or bouillon, black coffee or tea, clear juice, clear soft drinks or sports drinks, gelatin dessert, and popsicles. Avoid any liquids that contain red or purple dye.  On the day of the procedure - do not eat or drink anything during the 2 hours before the procedure, or within the time period that your health care provider recommends.  Bowel prep If you were prescribed an oral bowel prep  to clean out your colon:  Take it as told by your health care provider. Starting the day before your procedure, you will need to drink a large amount of medicated liquid. The liquid will cause you to have multiple loose stools until your stool is almost clear or light green.  If your skin or anus gets irritated from diarrhea, you may use these to relieve the irritation: ? Medicated wipes, such as adult wet wipes with aloe and vitamin E. ? A skin soothing-product like petroleum jelly.  If you vomit while drinking the bowel prep, take a break for up to 60 minutes and then begin the bowel prep again. If vomiting continues and you cannot take the bowel prep without vomiting, call your health care provider.  General instructions  Ask your health care provider about changing or stopping your regular medicines. This is especially important if you are taking diabetes medicines or blood thinners.  Plan to have someone take you home from the hospital or clinic. What happens during the procedure?  An IV tube may be inserted into one of your veins.  You will be given medicine to help you relax (sedative).  To reduce your risk of infection: ? Your health care team will wash or sanitize their hands. ? Your anal area will be washed with soap.  You will be asked to lie on your side with your knees bent.  Your health care provider will lubricate a long, thin, flexible tube. The tube will have a camera and a light on the end.  The tube will be inserted into your   anus.  The tube will be gently eased through your rectum and colon.  Air will be delivered into your colon to keep it open. You may feel some pressure or cramping.  The camera will be used to take images during the procedure.  A small tissue sample may be removed from your body to be examined under a microscope (biopsy). If any potential problems are found, the tissue will be sent to a lab for testing.  If small polyps are found, your  health care provider may remove them and have them checked for cancer cells.  The tube that was inserted into your anus will be slowly removed. The procedure may vary among health care providers and hospitals. What happens after the procedure?  Your blood pressure, heart rate, breathing rate, and blood oxygen level will be monitored until the medicines you were given have worn off.  Do not drive for 24 hours after the exam.  You may have a small amount of blood in your stool.  You may pass gas and have mild abdominal cramping or bloating due to the air that was used to inflate your colon during the exam.  It is up to you to get the results of your procedure. Ask your health care provider, or the department performing the procedure, when your results will be ready. This information is not intended to replace advice given to you by your health care provider. Make sure you discuss any questions you have with your health care provider. Document Released: 05/13/2000 Document Revised: 03/16/2016 Document Reviewed: 07/28/2015 Elsevier Interactive Patient Education  2018 Elsevier Inc.  

## 2018-02-15 NOTE — H&P (Signed)
Anna [name]; [id number]; 12/12/1945   HPI Patient is a 72-year-old black female who was referred to my care by Dr. Fagan for a screening colonoscopy.  She last had a colonoscopy 6 years ago.  She has a strong immediate family history of colon cancer in a sister and brother.  She denies abdominal pain, nausea, weight loss, blood per rectum.  She currently has 0 out of 10 abdominal pain. Past Medical History:  Diagnosis Date  . Anemia   . Aortic valve disorder   . Diabetes (HCC)   . HTN (hypertension)   . Hypercholesteremia   . Hyperlipidemia   . Hypertension   . Nodular goiter     Past Surgical History:  Procedure Laterality Date  . ABDOMINAL HYSTERECTOMY    . COLONOSCOPY    . COLONOSCOPY  11/22/2011   Procedure: COLONOSCOPY;  Surgeon: Journee Kohen A Lelaina Oatis, MD;  Location: [facility];  Service: Gastroenterology;  Laterality: N/A;  . Goiter removed      Family History  Problem Relation Age of Onset  . Colon cancer Father   . Colon cancer Sister     Current Outpatient Medications on File Prior to Visit  Medication Sig Dispense Refill  . gabapentin (NEURONTIN) 300 MG capsule Take 300 mg by mouth 2 (two) times daily.    . aspirin 81 MG tablet Take 81 mg by mouth daily.    . Cholecalciferol (VITAMIN D3) 1000 UNITS CAPS Take 1 capsule by mouth daily.    . Cyanocobalamin (B-12 PO) Take 1 tablet by mouth daily.    . Krill Oil Omega-3 500 MG CAPS Take 500 mg by mouth daily.    . losartan-hydrochlorothiazide (HYZAAR) 100-25 MG per tablet Take 1 tablet by mouth daily.    . Multiple Vitamins-Minerals (ONE-A-DAY ENERGY PO) Take 1 tablet by mouth daily.     . Propylene Glycol (SYSTANE BALANCE) 0.6 % SOLN Place 1 drop into both eyes 3 (three) times daily.     . Red Yeast Rice 600 MG CAPS Take 2 capsules by mouth daily.      No current facility-administered medications on file prior to visit.     No Known Allergies  Social History   Substance and Sexual Activity  Alcohol Use No     Social History   Tobacco Use  Smoking Status Never Smoker  Smokeless Tobacco Never Used    Review of Systems  Constitutional: Negative.   HENT: Negative.   Eyes: Negative.   Respiratory: Positive for wheezing.   Cardiovascular: Negative.   Gastrointestinal: Negative.   Genitourinary: Negative.   Musculoskeletal: Positive for back pain and joint pain.  Skin: Negative.   Neurological: Negative.   Endo/Heme/Allergies: Negative.   Psychiatric/Behavioral: Negative.     Objective   Vitals:   02/15/18 1003  BP: (!) 183/78  Pulse: 76  Resp: 20  Temp: 98 F (36.7 C)    Physical Exam  Constitutional: She is oriented to person, place, and time. She appears well-developed and well-nourished. No distress.  HENT:  Head: Normocephalic and atraumatic.  Cardiovascular: Normal rate, regular rhythm and normal heart sounds. Exam reveals no gallop and no friction rub.  No murmur heard. Pulmonary/Chest: Effort normal and breath sounds normal. No stridor. No respiratory distress. She has no wheezes. She has no rales.  Abdominal: Soft. Bowel sounds are normal. She exhibits no distension and no mass. There is no tenderness. There is no guarding.  Neurological: She is alert and oriented to person, place, and time.    Skin: Skin is warm and dry.  Vitals reviewed. Dr. Fagan's notes reviewed  Assessment  Need for screening colonoscopy, family history of colon cancer Plan   Patient is scheduled for a colonoscopy on 02/27/2018.  The risks and benefits of the procedure including bleeding and perforation were fully explained to who gave informed consent.  GoLYTELY prep has been prescribed. 

## 2018-02-16 DIAGNOSIS — M48062 Spinal stenosis, lumbar region with neurogenic claudication: Secondary | ICD-10-CM | POA: Diagnosis not present

## 2018-02-27 ENCOUNTER — Ambulatory Visit (HOSPITAL_COMMUNITY)
Admission: RE | Admit: 2018-02-27 | Discharge: 2018-02-27 | Disposition: A | Discharge status: Home / Self Care | Payer: PPO | Source: Ambulatory Visit | Attending: General Surgery | Admitting: General Surgery

## 2018-02-27 ENCOUNTER — Other Ambulatory Visit: Payer: Self-pay

## 2018-02-27 ENCOUNTER — Encounter (HOSPITAL_COMMUNITY)
Admission: RE | Disposition: A | Discharge status: Home / Self Care | Payer: Self-pay | Source: Ambulatory Visit | Attending: General Surgery

## 2018-02-27 ENCOUNTER — Encounter (HOSPITAL_COMMUNITY): Payer: Self-pay | Admitting: *Deleted

## 2018-02-27 DIAGNOSIS — Z1211 Encounter for screening for malignant neoplasm of colon: Secondary | ICD-10-CM | POA: Diagnosis not present

## 2018-02-27 DIAGNOSIS — I10 Essential (primary) hypertension: Secondary | ICD-10-CM | POA: Insufficient documentation

## 2018-02-27 DIAGNOSIS — K5730 Diverticulosis of large intestine without perforation or abscess without bleeding: Secondary | ICD-10-CM

## 2018-02-27 DIAGNOSIS — I358 Other nonrheumatic aortic valve disorders: Secondary | ICD-10-CM | POA: Diagnosis not present

## 2018-02-27 DIAGNOSIS — E7800 Pure hypercholesterolemia, unspecified: Secondary | ICD-10-CM | POA: Insufficient documentation

## 2018-02-27 DIAGNOSIS — E119 Type 2 diabetes mellitus without complications: Secondary | ICD-10-CM | POA: Insufficient documentation

## 2018-02-27 DIAGNOSIS — Z800 Family history of malignant neoplasm of digestive organs: Secondary | ICD-10-CM | POA: Diagnosis not present

## 2018-02-27 DIAGNOSIS — Z7982 Long term (current) use of aspirin: Secondary | ICD-10-CM | POA: Diagnosis not present

## 2018-02-27 HISTORY — PX: COLONOSCOPY: SHX5424

## 2018-02-27 SURGERY — COLONOSCOPY
Anesthesia: Moderate Sedation

## 2018-02-27 MED ORDER — STERILE WATER FOR IRRIGATION IR SOLN
Status: DC | PRN
  Administered 2018-02-27: 100 mL

## 2018-02-27 MED ORDER — SODIUM CHLORIDE 0.9 % IV SOLN
INTRAVENOUS | Status: DC
  Administered 2018-02-27: 07:00:00 via INTRAVENOUS

## 2018-02-27 MED ORDER — MIDAZOLAM HCL 5 MG/5ML IJ SOLN
INTRAMUSCULAR | Status: AC
  Filled 2018-02-27: qty 10

## 2018-02-27 MED ORDER — MEPERIDINE HCL 50 MG/ML IJ SOLN
INTRAMUSCULAR | Status: DC | PRN
  Administered 2018-02-27: 50 mg via INTRAVENOUS

## 2018-02-27 MED ORDER — MEPERIDINE HCL 50 MG/ML IJ SOLN
INTRAMUSCULAR | Status: AC
  Filled 2018-02-27: qty 1

## 2018-02-27 MED ORDER — MIDAZOLAM HCL 5 MG/5ML IJ SOLN
INTRAMUSCULAR | Status: DC | PRN
  Administered 2018-02-27: 2 mg via INTRAVENOUS
  Administered 2018-02-27: 1 mg via INTRAVENOUS

## 2018-02-27 NOTE — Op Note (Signed)
[facility] Patient Name: Anna [name] Procedure Date: 02/27/2018 7:09 AM MRN: [id number] Date of Birth: 04/11/1946 Attending MD: Franky Macho , MD CSN: [id number] Age: 72 Admit Type: Outpatient Procedure:                Colonoscopy Indications:              Screening for colorectal malignant neoplasm,                            Screening for colon cancer: Family history of                            colorectal cancer in distant relative(s) Providers:                Franky Macho, MD, Edrick Kins, RN, Burke Keels,                            Technician Referring MD:              Medicines:                Midazolam 3 mg IV, Meperidine 50 mg IV Complications:            No immediate complications. Estimated Blood Loss:     Estimated blood loss: none. Procedure:                Pre-Anesthesia Assessment:                           - Prior to the procedure, a History and Physical                            was performed, and patient medications and                            allergies were reviewed. The patient is competent.                            The risks and benefits of the procedure and the                            sedation options and risks were discussed with the                            patient. All questions were answered and informed                            consent was obtained. Patient identification and                            proposed procedure were verified by the physician,                            the nurse and the technician in the endoscopy  suite. Mental Status Examination: normal. Airway                            Examination: normal oropharyngeal airway and neck                            mobility. Respiratory Examination: clear to                            auscultation. CV Examination: RRR, no murmurs, no                            S3 or S4. Prophylactic Antibiotics: The patient                            does not  require prophylactic antibiotics. Prior                            Anticoagulants: The patient has taken aspirin [Days                            Prior to Procedure]. ASA Grade Assessment: II - A                            patient with mild systemic disease. After reviewing                            the risks and benefits, the patient was deemed in                            satisfactory condition to undergo the procedure.                            The anesthesia plan was to use moderate sedation /                            analgesia (conscious sedation). Immediately prior                            to administration of medications, the patient was                            re-assessed for adequacy to receive sedatives. The                            heart rate, respiratory rate, oxygen saturations,                            blood pressure, adequacy of pulmonary ventilation,                            and response to care were monitored throughout the  procedure. The physical status of the patient was                            re-assessed after the procedure.                           After obtaining informed consent, the colonoscope                            was passed under direct vision. Throughout the                            procedure, the patient's blood pressure, pulse, and                            oxygen saturations were monitored continuously. The                            [id number] ([id number]) scope was introduced through                            the anus and advanced to the the cecum, identified                            by the appendiceal orifice, ileocecal valve and                            palpation. No anatomical landmarks were                            photographed. The colonoscopy was performed without                            difficulty. The patient tolerated the procedure                            well. The quality of the bowel  preparation was                            adequate. The patient tolerated the procedure well.                            The total duration of the procedure was 12 minutes. Scope In: 7:30:08 AM Scope Out: 7:39:45 AM Scope Withdrawal Time: 0 hours 4 minutes 54 seconds  Total Procedure Duration: 0 hours 9 minutes 37 seconds  Findings:      The perianal and digital rectal examinations were normal.      A few small-mouthed diverticula were found in the sigmoid colon. There       was no evidence of diverticular bleeding. Estimated blood loss: none.      The exam was otherwise without abnormality on direct and retroflexion       views. Impression:               - Mild diverticulosis in the sigmoid colon. There  was no evidence of diverticular bleeding.                           - The examination was otherwise normal on direct                            and retroflexion views.                           - No specimens collected. Moderate Sedation:      Moderate (conscious) sedation was administered by the endoscopy nurse       and supervised by the endoscopist. The following parameters were       monitored: oxygen saturation, heart rate, blood pressure, and response       to care. Recommendation:           - Written discharge instructions were provided to                            the patient.                           - The signs and symptoms of potential delayed                            complications were discussed with the patient.                           - Patient has a contact number available for                            emergencies.                           - Return to normal activities tomorrow.                           - Resume previous diet.                           - Continue present medications.                           - No repeat colonoscopy due to age. Procedure Code(s):        --- Professional ---                           [phone],  Colonoscopy, flexible; diagnostic, including                            collection of specimen(s) by brushing or washing,                            when performed (separate procedure) Diagnosis Code(s):        --- Professional ---  Z12.11, Encounter for screening for malignant                            neoplasm of colon                           Z80.0, Family history of malignant neoplasm of                            digestive organs                           K57.30, Diverticulosis of large intestine without                            perforation or abscess without bleeding CPT copyright 2017 [employer]. All rights reserved. The codes documented in this report are preliminary and upon coder review may  be revised to meet current compliance requirements. Franky Macho, MD Franky Macho, MD 02/27/2018 7:45:46 AM This report has been signed electronically. Number of Addenda: 0

## 2018-02-27 NOTE — Discharge Instructions (Signed)
Colonoscopy, Adult, Care After °This sheet gives you information about how to care for yourself after your procedure. Your health care provider may also give you more specific instructions. If you have problems or questions, contact your health care provider. °What can I expect after the procedure? °After the procedure, it is common to have: °· A small amount of blood in your stool for 24 hours after the procedure. °· Some gas. °· Mild abdominal cramping or bloating. ° °Follow these instructions at home: °General instructions ° °· For the first 24 hours after the procedure: °? Do not drive or use machinery. °? Do not sign important documents. °? Do not drink alcohol. °? Do your regular daily activities at a slower pace than normal. °? Eat soft, easy-to-digest foods. °? Rest often. °· Take over-the-counter or prescription medicines only as told by your health care provider. °· It is up to you to get the results of your procedure. Ask your health care provider, or the department performing the procedure, when your results will be ready. °Relieving cramping and bloating °· Try walking around when you have cramps or feel bloated. °· Apply heat to your abdomen as told by your health care provider. Use a heat source that your health care provider recommends, such as a moist heat pack or a heating pad. °? Place a towel between your skin and the heat source. °? Leave the heat on for 20-30 minutes. °? Remove the heat if your skin turns bright red. This is especially important if you are unable to feel pain, heat, or cold. You may have a greater risk of getting burned. °Eating and drinking °· Drink enough fluid to keep your urine clear or pale yellow. °· Resume your normal diet as instructed by your health care provider. Avoid heavy or fried foods that are hard to digest. °· Avoid drinking alcohol for as long as instructed by your health care provider. °Contact a health care provider if: °· You have blood in your stool 2-3  days after the procedure. °Get help right away if: °· You have more than a small spotting of blood in your stool. °· You pass large blood clots in your stool. °· Your abdomen is swollen. °· You have nausea or vomiting. °· You have a fever. °· You have increasing abdominal pain that is not relieved with medicine. °This information is not intended to replace advice given to you by your health care provider. Make sure you discuss any questions you have with your health care provider. °Document Released: 12/29/2003 Document Revised: 02/08/2016 Document Reviewed: 07/28/2015 °Elsevier Interactive Patient Education © 2018 Elsevier Inc. ° ° °Diverticulosis °Diverticulosis is a condition that develops when small pouches (diverticula) form in the wall of the large intestine (colon). The colon is where water is absorbed and stool is formed. The pouches form when the inside layer of the colon pushes through weak spots in the outer layers of the colon. You may have a few pouches or many of them. °What are the causes? °The cause of this condition is not known. °What increases the risk? °The following factors may make you more likely to develop this condition: °· Being older than age 60. Your risk for this condition increases with age. Diverticulosis is rare among people younger than age 30. By age 80, many people have it. °· Eating a low-fiber diet. °· Having frequent constipation. °· Being overweight. °· Not getting enough exercise. °· Smoking. °· Taking over-the-counter pain medicines, like aspirin and ibuprofen. °·   Having a family history of diverticulosis. ° °What are the signs or symptoms? °In most people, there are no symptoms of this condition. If you do have symptoms, they may include: °· Bloating. °· Cramps in the abdomen. °· Constipation or diarrhea. °· Pain in the lower left side of the abdomen. ° °How is this diagnosed? °This condition is most often diagnosed during an exam for other colon problems. Because  diverticulosis usually has no symptoms, it often cannot be diagnosed independently. This condition may be diagnosed by: °· Using a flexible scope to examine the colon (colonoscopy). °· Taking an X-ray of the colon after dye has been put into the colon (barium enema). °· Doing a CT scan. ° °How is this treated? °You may not need treatment for this condition if you have never developed an infection related to diverticulosis. If you have had an infection before, treatment may include: °· Eating a high-fiber diet. This may include eating more fruits, vegetables, and grains. °· Taking a fiber supplement. °· Taking a live bacteria supplement (probiotic). °· Taking medicine to relax your colon. °· Taking antibiotic medicines. ° °Follow these instructions at home: °· Drink 6-8 glasses of water or more each day to prevent constipation. °· Try not to strain when you have a bowel movement. °· If you have had an infection before: °? Eat more fiber as directed by your health care provider or your diet and nutrition specialist (dietitian). °? Take a fiber supplement or probiotic, if your health care provider approves. °· Take over-the-counter and prescription medicines only as told by your health care provider. °· If you were prescribed an antibiotic, take it as told by your health care provider. Do not stop taking the antibiotic even if you start to feel better. °· Keep all follow-up visits as told by your health care provider. This is important. °Contact a health care provider if: °· You have pain in your abdomen. °· You have bloating. °· You have cramps. °· You have not had a bowel movement in 3 days. °Get help right away if: °· Your pain gets worse. °· Your bloating becomes very bad. °· You have a fever or chills, and your symptoms suddenly get worse. °· You vomit. °· You have bowel movements that are bloody or black. °· You have bleeding from your rectum. °Summary °· Diverticulosis is a condition that develops when small  pouches (diverticula) form in the wall of the large intestine (colon). °· You may have a few pouches or many of them. °· This condition is most often diagnosed during an exam for other colon problems. °· If you have had an infection related to diverticulosis, treatment may include increasing the fiber in your diet, taking supplements, or taking medicines. °This information is not intended to replace advice given to you by your health care provider. Make sure you discuss any questions you have with your health care provider. °Document Released: 02/11/2004 Document Revised: 04/04/2016 Document Reviewed: 04/04/2016 °Elsevier Interactive Patient Education © 2017 Elsevier Inc. ° ° °

## 2018-02-27 NOTE — Interval H&P Note (Signed)
History and Physical Interval Note:  02/27/2018 7:25 AM  Anna [name]  has presented today for surgery, with the diagnosis of screening  The various methods of treatment have been discussed with the patient and family. After consideration of risks, benefits and other options for treatment, the patient has consented to  Procedure(s): COLONOSCOPY (N/A) as a surgical intervention .  The patient's history has been reviewed, patient examined, no change in status, stable for surgery.  I have reviewed the patient's chart and labs.  Questions were answered to the patient's satisfaction.     Franky Macho

## 2018-03-01 ENCOUNTER — Encounter (HOSPITAL_COMMUNITY): Payer: Self-pay | Admitting: General Surgery

## 2018-03-21 DIAGNOSIS — M48062 Spinal stenosis, lumbar region with neurogenic claudication: Secondary | ICD-10-CM | POA: Diagnosis not present

## 2018-04-05 DIAGNOSIS — H40019 Open angle with borderline findings, low risk, unspecified eye: Secondary | ICD-10-CM | POA: Diagnosis not present

## 2018-04-05 DIAGNOSIS — H04129 Dry eye syndrome of unspecified lacrimal gland: Secondary | ICD-10-CM | POA: Diagnosis not present

## 2018-04-05 DIAGNOSIS — H18899 Other specified disorders of cornea, unspecified eye: Secondary | ICD-10-CM | POA: Diagnosis not present

## 2018-04-05 DIAGNOSIS — H25019 Cortical age-related cataract, unspecified eye: Secondary | ICD-10-CM | POA: Diagnosis not present

## 2018-04-12 DIAGNOSIS — E1129 Type 2 diabetes mellitus with other diabetic kidney complication: Secondary | ICD-10-CM | POA: Diagnosis not present

## 2018-04-12 DIAGNOSIS — Z23 Encounter for immunization: Secondary | ICD-10-CM | POA: Diagnosis not present

## 2018-04-12 DIAGNOSIS — I10 Essential (primary) hypertension: Secondary | ICD-10-CM | POA: Diagnosis not present

## 2018-04-25 DIAGNOSIS — Z6834 Body mass index (BMI) 34.0-34.9, adult: Secondary | ICD-10-CM | POA: Diagnosis not present

## 2018-04-25 DIAGNOSIS — I10 Essential (primary) hypertension: Secondary | ICD-10-CM | POA: Diagnosis not present

## 2018-04-25 DIAGNOSIS — M48062 Spinal stenosis, lumbar region with neurogenic claudication: Secondary | ICD-10-CM | POA: Diagnosis not present

## 2018-06-14 DIAGNOSIS — I10 Essential (primary) hypertension: Secondary | ICD-10-CM | POA: Diagnosis not present

## 2018-06-14 DIAGNOSIS — E1129 Type 2 diabetes mellitus with other diabetic kidney complication: Secondary | ICD-10-CM | POA: Diagnosis not present

## 2018-06-14 DIAGNOSIS — Z79899 Other long term (current) drug therapy: Secondary | ICD-10-CM | POA: Diagnosis not present

## 2018-06-21 DIAGNOSIS — I10 Essential (primary) hypertension: Secondary | ICD-10-CM | POA: Diagnosis not present

## 2018-06-21 DIAGNOSIS — E1129 Type 2 diabetes mellitus with other diabetic kidney complication: Secondary | ICD-10-CM | POA: Diagnosis not present

## 2018-10-15 ENCOUNTER — Other Ambulatory Visit (HOSPITAL_COMMUNITY): Payer: Self-pay | Admitting: Internal Medicine

## 2018-10-15 DIAGNOSIS — Z1231 Encounter for screening mammogram for malignant neoplasm of breast: Secondary | ICD-10-CM

## 2018-10-16 DIAGNOSIS — E1129 Type 2 diabetes mellitus with other diabetic kidney complication: Secondary | ICD-10-CM | POA: Diagnosis not present

## 2018-10-16 DIAGNOSIS — N183 Chronic kidney disease, stage 3 (moderate): Secondary | ICD-10-CM | POA: Diagnosis not present

## 2018-10-23 DIAGNOSIS — E1129 Type 2 diabetes mellitus with other diabetic kidney complication: Secondary | ICD-10-CM | POA: Diagnosis not present

## 2018-10-23 DIAGNOSIS — I10 Essential (primary) hypertension: Secondary | ICD-10-CM | POA: Diagnosis not present

## 2018-12-05 ENCOUNTER — Telehealth: Payer: Self-pay | Admitting: Obstetrics & Gynecology

## 2018-12-05 NOTE — Telephone Encounter (Signed)
Unable to reach pt with restrictions.  

## 2018-12-06 ENCOUNTER — Other Ambulatory Visit: Payer: Self-pay

## 2018-12-06 ENCOUNTER — Ambulatory Visit (INDEPENDENT_AMBULATORY_CARE_PROVIDER_SITE_OTHER): Payer: PPO | Admitting: Obstetrics & Gynecology

## 2018-12-06 ENCOUNTER — Encounter: Payer: Self-pay | Admitting: Obstetrics & Gynecology

## 2018-12-06 VITALS — BP 149/72 | HR 84 | Ht 62.0 in | Wt 178.0 lb

## 2018-12-06 DIAGNOSIS — Z1211 Encounter for screening for malignant neoplasm of colon: Secondary | ICD-10-CM

## 2018-12-06 DIAGNOSIS — Z01419 Encounter for gynecological examination (general) (routine) without abnormal findings: Secondary | ICD-10-CM | POA: Diagnosis not present

## 2018-12-06 DIAGNOSIS — Z1212 Encounter for screening for malignant neoplasm of rectum: Secondary | ICD-10-CM

## 2018-12-06 NOTE — Progress Notes (Signed)
Subjective:     Anna [name] is a 73 y.o. female here for a routine exam.  No LMP recorded. Patient has had a hysterectomy. G0P0000 Birth Control Method:  hysterectomy Menstrual Calendar(currently): amenorrheic  Current complaints: none.   Current acute medical issues:     Recent Gynecologic History No LMP recorded. Patient has had a hysterectomy. Last Pap: n/a,  normal Last mammogram: /2019,  normal  Past Medical History:  Diagnosis Date  . Anemia   . Aortic valve disorder   . HTN (hypertension)   . Hypercholesteremia   . Hyperlipidemia   . Hypertension   . Nodular goiter     Past Surgical History:  Procedure Laterality Date  . ABDOMINAL HYSTERECTOMY    . COLONOSCOPY    . COLONOSCOPY  11/22/2011   Procedure: COLONOSCOPY;  Surgeon: Jamesetta So, MD;  Location: [facility];  Service: Gastroenterology;  Laterality: N/A;  . COLONOSCOPY N/A 02/27/2018   Procedure: COLONOSCOPY;  Surgeon: Aviva Signs, MD;  Location: [facility];  Service: Gastroenterology;  Laterality: N/A;  . Goiter removed      OB History    Gravida  0   Para  0   Term  0   Preterm  0   AB  0   Living  0     SAB  0   TAB  0   Ectopic  0   Multiple  0   Live Births  0           Social History   Socioeconomic History  . Marital status: Married    Spouse name: Not on file  . Number of children: Not on file  . Years of education: Not on file  . Highest education level: Not on file  Occupational History  . Not on file  Social Needs  . Financial resource strain: Not on file  . Food insecurity    Worry: Not on file    Inability: Not on file  . Transportation needs    Medical: Not on file    Non-medical: Not on file  Tobacco Use  . Smoking status: Never Smoker  . Smokeless tobacco: Never Used  Substance and Sexual Activity  . Alcohol use: No  . Drug use: No  . Sexual activity: Yes  Lifestyle  . Physical activity    Days per week: Not on file    Minutes per  session: Not on file  . Stress: Not on file  Relationships  . Social Herbalist on phone: Not on file    Gets together: Not on file    Attends religious service: Not on file    Active member of club or organization: Not on file    Attends meetings of clubs or organizations: Not on file    Relationship status: Not on file  Other Topics Concern  . Not on file  Social History Narrative  . Not on file    Family History  Problem Relation Age of Onset  . Colon cancer Father   . Colon cancer Sister      Current Outpatient Medications:  .  acetaminophen (TYLENOL) 500 MG tablet, Take 1,000 mg by mouth daily as needed for moderate pain or headache., Disp: , Rfl:  .  amLODipine (NORVASC) 5 MG tablet, Take 5 mg by mouth at bedtime., Disp: , Rfl:  .  aspirin 81 MG tablet, Take 81 mg by mouth at bedtime. , Disp: , Rfl:  .  Krill Oil Omega-3 500 MG CAPS, Take 500 mg by mouth daily., Disp: , Rfl:  .  losartan-hydrochlorothiazide (HYZAAR) 100-25 MG per tablet, Take 1 tablet by mouth daily., Disp: , Rfl:  .  Multiple Vitamins-Minerals (ONE-A-DAY ENERGY PO), Take 1 tablet by mouth daily. , Disp: , Rfl:  .  Propylene Glycol (SYSTANE BALANCE) 0.6 % SOLN, Place 1 drop into both eyes 3 (three) times daily as needed (dry eyes). , Disp: , Rfl:  .  Red Yeast Rice 600 MG CAPS, Take 1,200 mg by mouth every evening. , Disp: , Rfl:  .  Cholecalciferol (VITAMIN D3) 1000 UNITS CAPS, Take 1,000 Units by mouth daily. , Disp: , Rfl:  .  Cyanocobalamin (B-12 PO), Take 1 tablet by mouth daily., Disp: , Rfl:  .  gabapentin (NEURONTIN) 300 MG capsule, Take 300 mg by mouth 2 (two) times daily., Disp: , Rfl:   Review of Systems  Review of Systems  Constitutional: Negative for fever, chills, weight loss, malaise/fatigue and diaphoresis.  HENT: Negative for hearing loss, ear pain, nosebleeds, congestion, sore throat, neck pain, tinnitus and ear discharge.   Eyes: Negative for blurred vision, double vision,  photophobia, pain, discharge and redness.  Respiratory: Negative for cough, hemoptysis, sputum production, shortness of breath, wheezing and stridor.   Cardiovascular: Negative for chest pain, palpitations, orthopnea, claudication, leg swelling and PND.  Gastrointestinal: negative for abdominal pain. Negative for heartburn, nausea, vomiting, diarrhea, constipation, blood in stool and melena.  Genitourinary: Negative for dysuria, urgency, frequency, hematuria and flank pain.  Musculoskeletal: Negative for myalgias, back pain, joint pain and falls.  Skin: Negative for itching and rash.  Neurological: Negative for dizziness, tingling, tremors, sensory change, speech change, focal weakness, seizures, loss of consciousness, weakness and headaches.  Endo/Heme/Allergies: Negative for environmental allergies and polydipsia. Does not bruise/bleed easily.  Psychiatric/Behavioral: Negative for depression, suicidal ideas, hallucinations, memory loss and substance abuse. The patient is not nervous/anxious and does not have insomnia.        Objective:  Blood pressure (!) 149/72, pulse 84, height 5\' 2"  (1.575 m), weight 178 lb (80.7 kg).   Physical Exam  Vitals reviewed. Constitutional: She is oriented to person, place, and time. She appears well-developed and well-nourished.  HENT:  Head: Normocephalic and atraumatic.        Right Ear: External ear normal.  Left Ear: External ear normal.  Nose: Nose normal.  Mouth/Throat: Oropharynx is clear and moist.  Eyes: Conjunctivae and EOM are normal. Pupils are equal, round, and reactive to light. Right eye exhibits no discharge. Left eye exhibits no discharge. No scleral icterus.  Neck: Normal range of motion. Neck supple. No tracheal deviation present. No thyromegaly present.  Cardiovascular: Normal rate, regular rhythm, normal heart sounds and intact distal pulses.  Exam reveals no gallop and no friction rub.   No murmur heard. Respiratory: Effort normal and  breath sounds normal. No respiratory distress. She has no wheezes. She has no rales. She exhibits no tenderness.  GI: Soft. Bowel sounds are normal. She exhibits no distension and no mass. There is no tenderness. There is no rebound and no guarding.  Genitourinary:  Breasts no masses skin changes or nipple changes bilaterally      Vulva is normal without lesions Vagina is pink moist without discharge Cervix absent Uterus is absent Adnexa is negative  {Rectal    hemoccult negative, normal tone, no masses  Musculoskeletal: Normal range of motion. She exhibits no edema and no tenderness.  Neurological: She is  alert and oriented to person, place, and time. She has normal reflexes. She displays normal reflexes. No cranial nerve deficit. She exhibits normal muscle tone. Coordination normal.  Skin: Skin is warm and dry. No rash noted. No erythema. No pallor.  Psychiatric: She has a normal mood and affect. Her behavior is normal. Judgment and thought content normal.       Medications Ordered at today's visit: No orders of the defined types were placed in this encounter.   Other orders placed at today's visit: No orders of the defined types were placed in this encounter.     Assessment:    Healthy female exam.    Plan:    Mammogram ordered. Follow up in: 3 years.     Return in about 3 years (around 12/05/2021) for yearly.

## 2018-12-17 ENCOUNTER — Ambulatory Visit (HOSPITAL_COMMUNITY)
Admission: RE | Admit: 2018-12-17 | Discharge: 2018-12-17 | Disposition: A | Discharge status: Home / Self Care | Payer: PPO | Source: Ambulatory Visit | Attending: Internal Medicine | Admitting: Internal Medicine

## 2018-12-17 ENCOUNTER — Other Ambulatory Visit: Payer: Self-pay

## 2018-12-17 DIAGNOSIS — Z1231 Encounter for screening mammogram for malignant neoplasm of breast: Secondary | ICD-10-CM | POA: Insufficient documentation

## 2019-01-31 DIAGNOSIS — N183 Chronic kidney disease, stage 3 (moderate): Secondary | ICD-10-CM | POA: Diagnosis not present

## 2019-01-31 DIAGNOSIS — E785 Hyperlipidemia, unspecified: Secondary | ICD-10-CM | POA: Diagnosis not present

## 2019-01-31 DIAGNOSIS — E1129 Type 2 diabetes mellitus with other diabetic kidney complication: Secondary | ICD-10-CM | POA: Diagnosis not present

## 2019-01-31 DIAGNOSIS — E049 Nontoxic goiter, unspecified: Secondary | ICD-10-CM | POA: Diagnosis not present

## 2019-01-31 DIAGNOSIS — Z79899 Other long term (current) drug therapy: Secondary | ICD-10-CM | POA: Diagnosis not present

## 2019-01-31 DIAGNOSIS — I10 Essential (primary) hypertension: Secondary | ICD-10-CM | POA: Diagnosis not present

## 2019-02-07 DIAGNOSIS — E1122 Type 2 diabetes mellitus with diabetic chronic kidney disease: Secondary | ICD-10-CM | POA: Diagnosis not present

## 2019-02-07 DIAGNOSIS — M791 Myalgia, unspecified site: Secondary | ICD-10-CM | POA: Diagnosis not present

## 2019-02-07 DIAGNOSIS — N183 Chronic kidney disease, stage 3 (moderate): Secondary | ICD-10-CM | POA: Diagnosis not present

## 2019-02-07 DIAGNOSIS — I10 Essential (primary) hypertension: Secondary | ICD-10-CM | POA: Diagnosis not present

## 2019-02-08 ENCOUNTER — Other Ambulatory Visit (HOSPITAL_COMMUNITY): Payer: Self-pay | Admitting: Internal Medicine

## 2019-02-08 DIAGNOSIS — Z780 Asymptomatic menopausal state: Secondary | ICD-10-CM

## 2019-02-21 ENCOUNTER — Ambulatory Visit (HOSPITAL_COMMUNITY)
Admission: RE | Admit: 2019-02-21 | Discharge: 2019-02-21 | Disposition: A | Discharge status: Home / Self Care | Payer: PPO | Source: Ambulatory Visit | Attending: Internal Medicine | Admitting: Internal Medicine

## 2019-02-21 ENCOUNTER — Other Ambulatory Visit: Payer: Self-pay

## 2019-02-21 DIAGNOSIS — Z13820 Encounter for screening for osteoporosis: Secondary | ICD-10-CM | POA: Diagnosis not present

## 2019-02-21 DIAGNOSIS — Z780 Asymptomatic menopausal state: Secondary | ICD-10-CM | POA: Insufficient documentation

## 2019-04-04 ENCOUNTER — Other Ambulatory Visit: Payer: Self-pay

## 2019-04-04 DIAGNOSIS — Z20822 Contact with and (suspected) exposure to covid-19: Secondary | ICD-10-CM

## 2019-04-05 LAB — NOVEL CORONAVIRUS, NAA: SARS-CoV-2, NAA: NOT DETECTED

## 2019-04-10 ENCOUNTER — Telehealth: Payer: Self-pay | Admitting: Internal Medicine

## 2019-04-10 NOTE — Telephone Encounter (Signed)
Negative COVID results given. Patient results "NOT Detected." Caller expressed understanding. ° °

## 2019-06-03 DIAGNOSIS — N183 Chronic kidney disease, stage 3 unspecified: Secondary | ICD-10-CM | POA: Diagnosis not present

## 2019-06-03 DIAGNOSIS — Z79899 Other long term (current) drug therapy: Secondary | ICD-10-CM | POA: Diagnosis not present

## 2019-06-03 DIAGNOSIS — E1129 Type 2 diabetes mellitus with other diabetic kidney complication: Secondary | ICD-10-CM | POA: Diagnosis not present

## 2019-06-03 DIAGNOSIS — D649 Anemia, unspecified: Secondary | ICD-10-CM | POA: Diagnosis not present

## 2019-06-10 ENCOUNTER — Ambulatory Visit (HOSPITAL_COMMUNITY)
Admission: RE | Admit: 2019-06-10 | Discharge: 2019-06-10 | Disposition: A | Discharge status: Home / Self Care | Payer: PPO | Source: Ambulatory Visit | Attending: Internal Medicine | Admitting: Internal Medicine

## 2019-06-10 ENCOUNTER — Other Ambulatory Visit (HOSPITAL_COMMUNITY): Payer: Self-pay | Admitting: Internal Medicine

## 2019-06-10 ENCOUNTER — Other Ambulatory Visit: Payer: Self-pay

## 2019-06-10 DIAGNOSIS — R0602 Shortness of breath: Secondary | ICD-10-CM | POA: Insufficient documentation

## 2019-06-10 DIAGNOSIS — I10 Essential (primary) hypertension: Secondary | ICD-10-CM | POA: Diagnosis not present

## 2019-06-10 DIAGNOSIS — E1129 Type 2 diabetes mellitus with other diabetic kidney complication: Secondary | ICD-10-CM | POA: Diagnosis not present

## 2019-06-10 DIAGNOSIS — D649 Anemia, unspecified: Secondary | ICD-10-CM | POA: Diagnosis not present

## 2019-06-11 DIAGNOSIS — H401130 Primary open-angle glaucoma, bilateral, stage unspecified: Secondary | ICD-10-CM | POA: Diagnosis not present

## 2019-07-05 ENCOUNTER — Other Ambulatory Visit (HOSPITAL_COMMUNITY): Payer: Self-pay | Admitting: Respiratory Therapy

## 2019-07-05 DIAGNOSIS — R0602 Shortness of breath: Secondary | ICD-10-CM

## 2019-07-25 DIAGNOSIS — H25813 Combined forms of age-related cataract, bilateral: Secondary | ICD-10-CM | POA: Diagnosis not present

## 2019-07-25 DIAGNOSIS — H40013 Open angle with borderline findings, low risk, bilateral: Secondary | ICD-10-CM | POA: Diagnosis not present

## 2019-07-28 ENCOUNTER — Ambulatory Visit: Payer: PPO | Attending: Internal Medicine

## 2019-07-28 DIAGNOSIS — Z23 Encounter for immunization: Secondary | ICD-10-CM | POA: Insufficient documentation

## 2019-07-28 NOTE — Progress Notes (Signed)
   Covid-19 Vaccination Clinic  Name:  [name]    MRN: [id number] DOB: [birth year]  07/28/2019  Ms. Anna [name] was observed post Covid-19 immunization for 15 minutes without incidence. She was provided with Vaccine Information Sheet and instruction to access the V-Safe system.   Ms. Anna [name] was instructed to call 911 with any severe reactions post vaccine: Marland Kitchen Difficulty breathing  . Swelling of your face and throat  . A fast heartbeat  . A bad rash all over your body  . Dizziness and weakness    Immunizations Administered    Name Date Dose VIS Date Route   Pfizer COVID-19 Vaccine 07/28/2019  3:14 PM 0.3 mL 05/10/2019 Intramuscular   Manufacturer: [employer], [employer]   Lot: [id number]   NDC: [address]-9

## 2019-08-01 ENCOUNTER — Other Ambulatory Visit (HOSPITAL_COMMUNITY)
Admission: RE | Admit: 2019-08-01 | Discharge: 2019-08-01 | Disposition: A | Discharge status: Home / Self Care | Payer: PPO | Source: Ambulatory Visit | Attending: Internal Medicine | Admitting: Internal Medicine

## 2019-08-01 ENCOUNTER — Other Ambulatory Visit: Payer: Self-pay

## 2019-08-01 DIAGNOSIS — Z01812 Encounter for preprocedural laboratory examination: Secondary | ICD-10-CM | POA: Diagnosis not present

## 2019-08-01 DIAGNOSIS — Z20822 Contact with and (suspected) exposure to covid-19: Secondary | ICD-10-CM | POA: Insufficient documentation

## 2019-08-02 LAB — SARS CORONAVIRUS 2 (TAT 6-24 HRS): SARS Coronavirus 2: NEGATIVE

## 2019-08-06 ENCOUNTER — Ambulatory Visit (HOSPITAL_COMMUNITY)
Admission: RE | Admit: 2019-08-06 | Discharge: 2019-08-06 | Disposition: A | Discharge status: Home / Self Care | Payer: PPO | Source: Ambulatory Visit | Attending: Internal Medicine | Admitting: Internal Medicine

## 2019-08-06 ENCOUNTER — Other Ambulatory Visit: Payer: Self-pay

## 2019-08-06 DIAGNOSIS — R0602 Shortness of breath: Secondary | ICD-10-CM | POA: Diagnosis not present

## 2019-08-06 LAB — PULMONARY FUNCTION TEST
DL/VA % pred: 120 %
DL/VA: 5.06 ml/min/mmHg/L
DLCO unc % pred: 86 %
DLCO unc: 14.9 ml/min/mmHg
FEF 25-75 Post: 1.37 L/sec
FEF 25-75 Pre: 1.34 L/sec
FEF2575-%Change-Post: 2 %
FEF2575-%Pred-Post: 105 %
FEF2575-%Pred-Pre: 103 %
FEV1-%Change-Post: -2 %
FEV1-%Pred-Post: 90 %
FEV1-%Pred-Pre: 92 %
FEV1-Post: 1.3 L
FEV1-Pre: 1.33 L
FEV1FVC-%Change-Post: -7 %
FEV1FVC-%Pred-Pre: 119 %
FEV6-%Change-Post: 2 %
FEV6-%Pred-Post: 83 %
FEV6-%Pred-Pre: 81 %
FEV6-Post: 1.49 L
FEV6-Pre: 1.45 L
FEV6FVC-%Pred-Post: 105 %
FEV6FVC-%Pred-Pre: 105 %
FVC-%Change-Post: 5 %
FVC-%Pred-Post: 81 %
FVC-%Pred-Pre: 77 %
FVC-Post: 1.54 L
FVC-Pre: 1.45 L
Post FEV1/FVC ratio: 84 %
Post FEV6/FVC ratio: 100 %
Pre FEV1/FVC ratio: 91 %
Pre FEV6/FVC Ratio: 100 %
RV % pred: 314 %
RV: 6.72 L
TLC % pred: 183 %
TLC: 8.47 L

## 2019-08-06 MED ORDER — ALBUTEROL SULFATE (2.5 MG/3ML) 0.083% IN NEBU
2.5000 mg | INHALATION_SOLUTION | Freq: Once | RESPIRATORY_TRACT | Status: AC
  Administered 2019-08-06: 09:00:00 2.5 mg via RESPIRATORY_TRACT

## 2019-08-07 NOTE — H&P (Signed)
Surgical History & Physical  Patient Name: Anna [name] DOB: [birth year]  Surgery: Cataract extraction with intraocular lens implant phacoemulsification; Left Eye  Surgeon: Fabio Pierce MD Surgery Date:  08/19/2019 Pre-Op Date:  07/25/2019  HPI: A 39 Yr. old female patient -referred by Dr. Charise Killian for [facility] (s/p SLT OU 2006) and NSC OU 1. 1. The patient complains of difficulty when viewing TV, reading closed caption, news scrolls on TV, which began 1 years ago. Both eyes are affected. The episode is gradual. The patient describes foggy and hazy symptoms affecting their eyes/vision. The condition's severity is moderate. Symptoms occur when the patient is driving and reading. Patient not comfortable driving at night any longer due to significant glare with headlights and street lights. Unable to do puzzles and brain games comfortably any longer. Patient ready to proceed with cataract surgery if necessary for BCVA. This is negatively affecting the patient's quality of life. HPI  Medical History: Glaucoma Cataracts Diabetes High Blood Pressure LDL back pain  Review of Systems Negative Allergic/Immunologic Negative Cardiovascular Negative Constitutional Negative Ear, Nose, Mouth & Throat Negative Endocrine Negative Eyes Negative Gastrointestinal Negative Genitourinary Negative Hemotologic/Lymphatic Negative Integumentary Negative Musculoskeletal Negative Neurological Negative Psychiatry Negative Respiratory  Social   Never smoked   Medication Soothe XP,  Losartan-Hydrochlorothiazide, Additional fluid pill, Garlic, Glucosamine Sulfate, Red yeast rice, Tylenol, Omega 3,   Sx/Procedures SLT OU 2006,  Hysterectomy,   Drug Allergies  Lisinopril,   History & Physical: Heent:  Cataract, Left eye NECK: supple without bruits LUNGS: lungs clear to auscultation CV: regular rate and rhythm Abdomen: soft and non-tender Impression & Plan: Assessment: 1.  COMBINED FORMS AGE  RELATED CATARACT; Both Eyes (H25.813) 2.  OAG BORDERLINE FINDINGS LOW RISK; Both Eyes (H40.013)  Plan: 1.  Cataract accounts for the patient's decreased vision. This visual impairment is not correctable with a tolerable change in glasses or contact lenses. Cataract surgery with an implantation of a new lens should significantly improve the visual and functional status of the patient. Discussed all risks, benefits, alternatives, and potential complications. Discussed the procedures and recovery. Patient desires to have surgery. A-scan ordered and performed today for intra-ocular lens calculations. The surgery will be performed in order to improve vision for driving, reading, and for eye examinations. Recommend phacoemulsification with intra-ocular lens. Left Eye. Dilates well - shugarcaine by protocol. 2.  Based on cup-to-disc ratio. OCT rNFL WNL OS today. S/P SLT in remote history. IOPs at goal today. Detailed discussion about glaucoma today including importance of maintaining good follow up and following treatment plan, and the possibility of irreversible blindness as part of this disease process.

## 2019-08-09 NOTE — Patient Instructions (Signed)
Anna [name]  08/09/2019     @PREFPERIOPPHARMACY @   Your procedure is scheduled on  08/19/2019 .  Report to 08/21/2019 at  1035  A.M.  Call this number if you have problems the morning of surgery:  [phone]   Remember:  Do not eat or drink after midnight.                         Take these medicines the morning of surgery with A SIP OF WATER  Gabapentin, losartan    Do not wear jewelry, make-up or nail polish.  Do not wear lotions, powders, or perfumes. Please wear deodorant and brush your teeth.  Do not shave 48 hours prior to surgery.  Men may shave face and neck.  Do not bring valuables to the hospital.  [facility] is not responsible for any belongings or valuables.  Contacts, dentures or bridgework may not be worn into surgery.  Leave your suitcase in the car.  After surgery it may be brought to your room.  For patients admitted to the hospital, discharge time will be determined by your treatment team.  Patients discharged the day of surgery will not be allowed to drive home.   Name and phone number of your driver:   family Special instructions:  DO NOT smoke the morning of your procedure.  Please read over the following fact sheets that you were given. Anesthesia Post-op Instructions and Care and Recovery After Surgery       Cataract Surgery, Care After This sheet gives you information about how to care for yourself after your procedure. Your health care provider may also give you more specific instructions. If you have problems or questions, contact your health care provider. What can I expect after the procedure? After the procedure, it is common to have:  Itching.  Discomfort.  Fluid discharge.  Sensitivity to light and to touch.  Bruising in or around the eye.  Mild blurred vision. Follow these instructions at home: Eye care   Do not touch or rub your eyes.  Protect your eyes as told by your health care provider. You may be  told to wear a protective eye shield or sunglasses.  Do not put a contact lens into the affected eye or eyes until your health care provider approves.  Keep the area around your eye clean and dry: ? Avoid swimming. ? Do not allow water to hit you directly in the face while showering. ? Keep soap and shampoo out of your eyes.  Check your eye every day for signs of infection. Watch for: ? Redness, swelling, or pain. ? Fluid, blood, or pus. ? Warmth. ? A bad smell. ? Vision that is getting worse. ? Sensitivity that is getting worse. Activity  Do not drive for 24 hours if you were given a sedative during your procedure.  Avoid strenuous activities, such as playing contact sports, for as long as told by your health care provider.  Do not drive or use heavy machinery until your health care provider approves.  Do not bend or lift heavy objects. Bending increases pressure in the eye. You can walk, climb stairs, and do light household chores.  Ask your health care provider when you can return to work. If you work in a dusty environment, you may be advised to wear protective eyewear for a period of time. General instructions  Take or apply over-the-counter and prescription medicines  only as told by your health care provider. This includes eye drops.  Keep all follow-up visits as told by your health care provider. This is important. Contact a health care provider if:  You have increased bruising around your eye.  You have pain that is not helped with medicine.  You have a fever.  You have redness, swelling, or pain in your eye.  You have fluid, blood, or pus coming from your incision.  Your vision gets worse.  Your sensitivity to light gets worse. Get help right away if:  You have sudden loss of vision.  You see flashes of light or spots (floaters).  You have severe eye pain.  You develop nausea or vomiting. Summary  After your procedure, it is common to have itching,  discomfort, bruising, fluid discharge, or sensitivity to light.  Follow instructions from your health care provider about caring for your eye after the procedure.  Do not rub your eye after the procedure. You may need to wear eye protection or sunglasses. Do not wear contact lenses. Keep the area around your eye clean and dry.  Avoid activities that require a lot of effort. These include playing sports and lifting heavy objects.  Contact a health care provider if you have increased bruising, pain that does not go away, or a fever. Get help right away if you suddenly lose your vision, see flashes of light or spots, or have severe pain in the eye. This information is not intended to replace advice given to you by your health care provider. Make sure you discuss any questions you have with your health care provider. Document Revised: 03/12/2019 Document Reviewed: 11/13/2017 Elsevier Patient Education  2020 [place] After These instructions provide you with information about caring for yourself after your procedure. Your health care provider may also give you more specific instructions. Your treatment has been planned according to current medical practices, but problems sometimes occur. Call your health care provider if you have any problems or questions after your procedure. What can I expect after the procedure? After your procedure, you may:  Feel sleepy for several hours.  Feel clumsy and have poor balance for several hours.  Feel forgetful about what happened after the procedure.  Have poor judgment for several hours.  Feel nauseous or vomit.  Have a sore throat if you had a breathing tube during the procedure. Follow these instructions at home: For at least 24 hours after the procedure:      Have a responsible adult stay with you. It is important to have someone help care for you until you are awake and alert.  Rest as needed.  Do  not: ? Participate in activities in which you could fall or become injured. ? Drive. ? Use heavy machinery. ? Drink alcohol. ? Take sleeping pills or medicines that cause drowsiness. ? Make important decisions or sign legal documents. ? Take care of children on your own. Eating and drinking  Follow the diet that is recommended by your health care provider.  If you vomit, drink water, juice, or soup when you can drink without vomiting.  Make sure you have little or no nausea before eating solid foods. General instructions  Take over-the-counter and prescription medicines only as told by your health care provider.  If you have sleep apnea, surgery and certain medicines can increase your risk for breathing problems. Follow instructions from your health care provider about wearing your sleep device: ? Anytime you are  sleeping, including during daytime naps. ? While taking prescription pain medicines, sleeping medicines, or medicines that make you drowsy.  If you smoke, do not smoke without supervision.  Keep all follow-up visits as told by your health care provider. This is important. Contact a health care provider if:  You keep feeling nauseous or you keep vomiting.  You feel light-headed.  You develop a rash.  You have a fever. Get help right away if:  You have trouble breathing. Summary  For several hours after your procedure, you may feel sleepy and have poor judgment.  Have a responsible adult stay with you for at least 24 hours or until you are awake and alert. This information is not intended to replace advice given to you by your health care provider. Make sure you discuss any questions you have with your health care provider. Document Revised: 08/14/2017 Document Reviewed: 09/06/2015 Elsevier Patient Education  [place].

## 2019-08-12 DIAGNOSIS — H25812 Combined forms of age-related cataract, left eye: Secondary | ICD-10-CM | POA: Diagnosis not present

## 2019-08-14 ENCOUNTER — Encounter (HOSPITAL_COMMUNITY)
Admission: RE | Admit: 2019-08-14 | Discharge: 2019-08-14 | Disposition: A | Discharge status: Home / Self Care | Payer: PPO | Source: Ambulatory Visit | Attending: Ophthalmology | Admitting: Ophthalmology

## 2019-08-14 ENCOUNTER — Other Ambulatory Visit: Payer: Self-pay

## 2019-08-14 DIAGNOSIS — Z01818 Encounter for other preprocedural examination: Secondary | ICD-10-CM | POA: Diagnosis not present

## 2019-08-14 DIAGNOSIS — I498 Other specified cardiac arrhythmias: Secondary | ICD-10-CM | POA: Diagnosis not present

## 2019-08-14 DIAGNOSIS — R9431 Abnormal electrocardiogram [ECG] [EKG]: Secondary | ICD-10-CM | POA: Insufficient documentation

## 2019-08-14 DIAGNOSIS — I444 Left anterior fascicular block: Secondary | ICD-10-CM | POA: Insufficient documentation

## 2019-08-14 LAB — BASIC METABOLIC PANEL
Anion gap: 9 (ref 5–15)
BUN: 16 mg/dL (ref 8–23)
CO2: 27 mmol/L (ref 22–32)
Calcium: 8.8 mg/dL — ABNORMAL LOW (ref 8.9–10.3)
Chloride: 102 mmol/L (ref 98–111)
Creatinine, Ser: 1.01 mg/dL — ABNORMAL HIGH (ref 0.44–1.00)
GFR calc Af Amer: >60 mL/min (ref >60)
GFR calc non Af Amer: 54 mL/min — ABNORMAL LOW (ref >60)
Glucose, Bld: 115 mg/dL — ABNORMAL HIGH (ref 70–99)
Potassium: 3.6 mmol/L (ref 3.5–5.1)
Sodium: 138 mmol/L (ref 135–145)

## 2019-08-16 ENCOUNTER — Other Ambulatory Visit (HOSPITAL_COMMUNITY)
Admission: RE | Admit: 2019-08-16 | Discharge: 2019-08-16 | Disposition: A | Discharge status: Home / Self Care | Payer: PPO | Source: Ambulatory Visit | Attending: Ophthalmology | Admitting: Ophthalmology

## 2019-08-16 ENCOUNTER — Other Ambulatory Visit: Payer: Self-pay

## 2019-08-16 DIAGNOSIS — Z01812 Encounter for preprocedural laboratory examination: Secondary | ICD-10-CM | POA: Diagnosis not present

## 2019-08-16 DIAGNOSIS — Z20822 Contact with and (suspected) exposure to covid-19: Secondary | ICD-10-CM | POA: Diagnosis not present

## 2019-08-17 LAB — SARS CORONAVIRUS 2 (TAT 6-24 HRS): SARS Coronavirus 2: NEGATIVE

## 2019-08-19 ENCOUNTER — Encounter (HOSPITAL_COMMUNITY)
Admission: RE | Disposition: A | Discharge status: Home / Self Care | Payer: Self-pay | Source: Home / Self Care | Attending: Ophthalmology

## 2019-08-19 ENCOUNTER — Ambulatory Visit (HOSPITAL_COMMUNITY): Payer: PPO | Admitting: Anesthesiology

## 2019-08-19 ENCOUNTER — Ambulatory Visit (HOSPITAL_COMMUNITY)
Admission: RE | Admit: 2019-08-19 | Discharge: 2019-08-19 | Disposition: A | Discharge status: Home / Self Care | Payer: PPO | Attending: Ophthalmology | Admitting: Ophthalmology

## 2019-08-19 ENCOUNTER — Encounter (HOSPITAL_COMMUNITY): Payer: Self-pay | Admitting: Ophthalmology

## 2019-08-19 DIAGNOSIS — Z79899 Other long term (current) drug therapy: Secondary | ICD-10-CM | POA: Insufficient documentation

## 2019-08-19 DIAGNOSIS — E1139 Type 2 diabetes mellitus with other diabetic ophthalmic complication: Secondary | ICD-10-CM | POA: Diagnosis not present

## 2019-08-19 DIAGNOSIS — H42 Glaucoma in diseases classified elsewhere: Secondary | ICD-10-CM | POA: Diagnosis not present

## 2019-08-19 DIAGNOSIS — Z888 Allergy status to other drugs, medicaments and biological substances status: Secondary | ICD-10-CM | POA: Insufficient documentation

## 2019-08-19 DIAGNOSIS — Z90710 Acquired absence of both cervix and uterus: Secondary | ICD-10-CM | POA: Diagnosis not present

## 2019-08-19 DIAGNOSIS — H2512 Age-related nuclear cataract, left eye: Secondary | ICD-10-CM | POA: Diagnosis not present

## 2019-08-19 DIAGNOSIS — D649 Anemia, unspecified: Secondary | ICD-10-CM | POA: Diagnosis not present

## 2019-08-19 DIAGNOSIS — E1136 Type 2 diabetes mellitus with diabetic cataract: Secondary | ICD-10-CM | POA: Diagnosis not present

## 2019-08-19 DIAGNOSIS — H25812 Combined forms of age-related cataract, left eye: Secondary | ICD-10-CM | POA: Diagnosis not present

## 2019-08-19 HISTORY — PX: CATARACT EXTRACTION W/PHACO: SHX586

## 2019-08-19 SURGERY — PHACOEMULSIFICATION, CATARACT, WITH IOL INSERTION
Anesthesia: Monitor Anesthesia Care | Site: Eye | Laterality: Left

## 2019-08-19 MED ORDER — LIDOCAINE HCL (PF) 1 % IJ SOLN
INTRAOCULAR | Status: DC | PRN
  Administered 2019-08-19: 1 mL via OPHTHALMIC

## 2019-08-19 MED ORDER — MIDAZOLAM HCL 2 MG/2ML IJ SOLN
INTRAMUSCULAR | Status: AC
  Filled 2019-08-19: qty 4

## 2019-08-19 MED ORDER — SODIUM HYALURONATE 23 MG/ML IO SOLN
INTRAOCULAR | Status: DC | PRN
  Administered 2019-08-19: 0.6 mL via INTRAOCULAR

## 2019-08-19 MED ORDER — NEOMYCIN-POLYMYXIN-DEXAMETH 3.5-10000-0.1 OP SUSP
OPHTHALMIC | Status: DC | PRN
  Administered 2019-08-19: 1 [drp] via OPHTHALMIC

## 2019-08-19 MED ORDER — PHENYLEPHRINE HCL 2.5 % OP SOLN
1.0000 [drp] | OPHTHALMIC | Status: AC | PRN
  Administered 2019-08-19 (×3): 1 [drp] via OPHTHALMIC

## 2019-08-19 MED ORDER — EPINEPHRINE PF 1 MG/ML IJ SOLN
INTRAOCULAR | Status: DC | PRN
  Administered 2019-08-19: 500 mL

## 2019-08-19 MED ORDER — BSS IO SOLN
INTRAOCULAR | Status: DC | PRN
  Administered 2019-08-19: 15 mL via INTRAOCULAR

## 2019-08-19 MED ORDER — LIDOCAINE HCL 3.5 % OP GEL
1.0000 "application " | Freq: Once | OPHTHALMIC | Status: AC
  Administered 2019-08-19: 1 via OPHTHALMIC

## 2019-08-19 MED ORDER — MIDAZOLAM HCL 5 MG/5ML IJ SOLN
INTRAMUSCULAR | Status: DC | PRN
  Administered 2019-08-19: 1 mg via INTRAVENOUS

## 2019-08-19 MED ORDER — PROVISC 10 MG/ML IO SOLN
INTRAOCULAR | Status: DC | PRN
  Administered 2019-08-19: 0.85 mL via INTRAOCULAR

## 2019-08-19 MED ORDER — POVIDONE-IODINE 5 % OP SOLN
OPHTHALMIC | Status: DC | PRN
  Administered 2019-08-19: 1 via OPHTHALMIC

## 2019-08-19 MED ORDER — TETRACAINE HCL 0.5 % OP SOLN
1.0000 [drp] | OPHTHALMIC | Status: AC | PRN
  Administered 2019-08-19 (×3): 1 [drp] via OPHTHALMIC

## 2019-08-19 MED ORDER — CYCLOPENTOLATE-PHENYLEPHRINE 0.2-1 % OP SOLN
1.0000 [drp] | OPHTHALMIC | Status: AC | PRN
  Administered 2019-08-19 (×3): 1 [drp] via OPHTHALMIC

## 2019-08-19 MED ORDER — EPINEPHRINE PF 1 MG/ML IJ SOLN
INTRAMUSCULAR | Status: AC
  Filled 2019-08-19: qty 2

## 2019-08-19 SURGICAL SUPPLY — 12 items
CLOTH BEACON ORANGE TIMEOUT ST (SAFETY) ×2 IMPLANT
EYE SHIELD UNIVERSAL CLEAR (GAUZE/BANDAGES/DRESSINGS) ×2 IMPLANT
GLOVE BIOGEL PI IND STRL 7.0 (GLOVE) ×2 IMPLANT
GLOVE BIOGEL PI INDICATOR 7.0 (GLOVE) ×2
LENS ALC ACRYL/TECN (Ophthalmic Related) ×2 IMPLANT
NEEDLE HYPO 18GX1.5 BLUNT FILL (NEEDLE) ×2 IMPLANT
PAD ARMBOARD 7.5X6 YLW CONV (MISCELLANEOUS) ×2 IMPLANT
SYR TB 1ML LL NO SAFETY (SYRINGE) ×2 IMPLANT
TAPE SURG TRANSPORE 1 IN (GAUZE/BANDAGES/DRESSINGS) ×1 IMPLANT
TAPE SURGICAL TRANSPORE 1 IN (GAUZE/BANDAGES/DRESSINGS) ×2
VISCOELASTIC ADDITIONAL (OPHTHALMIC RELATED) ×2 IMPLANT
WATER STERILE IRR 250ML POUR (IV SOLUTION) ×2 IMPLANT

## 2019-08-19 NOTE — Anesthesia Postprocedure Evaluation (Signed)
Anesthesia Post Note  Patient: Anna [name]  Procedure(s) Performed: CATARACT EXTRACTION PHACO AND INTRAOCULAR LENS PLACEMENT (IOC) (Left Eye)  Patient location during evaluation: Phase II Anesthesia Type: MAC Level of consciousness: awake and alert Pain management: pain level controlled Vital Signs Assessment: post-procedure vital signs reviewed and stable Respiratory status: spontaneous breathing, nonlabored ventilation, respiratory function stable and patient connected to nasal cannula oxygen Cardiovascular status: stable and blood pressure returned to baseline Postop Assessment: no apparent nausea or vomiting Anesthetic complications: no     Last Vitals:  Vitals:   08/19/19 1037  BP: (!) 159/73  Pulse: 75  Resp: 20  Temp: 37.2 C  SpO2: 98%    Last Pain:  Vitals:   08/19/19 1037  TempSrc: Oral  PainSc: 0-No pain                 Caroline Matters

## 2019-08-19 NOTE — Anesthesia Preprocedure Evaluation (Addendum)
Anesthesia Evaluation  Patient identified by MRN, date of birth, ID band Patient awake    Reviewed: Allergy & Precautions, NPO status , Patient's Chart, lab work & pertinent test results, reviewed documented beta blocker date and time , Unable to perform ROS - Chart review only  Airway Mallampati: II       Dental no notable dental hx.    Pulmonary    Pulmonary exam normal        Cardiovascular hypertension, Normal cardiovascular exam     Neuro/Psych negative neurological ROS  negative psych ROS   GI/Hepatic negative GI ROS, Neg liver ROS,   Endo/Other  diabetes, Type 2  Renal/GU negative Renal ROS  negative genitourinary   Musculoskeletal   Abdominal   Peds  Hematology  (+) Blood dyscrasia, anemia ,   Anesthesia Other Findings   Reproductive/Obstetrics                             Anesthesia Physical Anesthesia Plan  ASA: II  Anesthesia Plan: MAC   Post-op Pain Management:    Induction:   PONV Risk Score and Plan: 2 and Treatment may vary due to age or medical condition  Airway Management Planned:   Additional Equipment:   Intra-op Plan:   Post-operative Plan:   Informed Consent: I have reviewed the patients History and Physical, chart, labs and discussed the procedure including the risks, benefits and alternatives for the proposed anesthesia with the patient or authorized representative who has indicated his/her understanding and acceptance.       Plan Discussed with:   Anesthesia Plan Comments:        Anesthesia Quick Evaluation

## 2019-08-19 NOTE — Discharge Instructions (Signed)
Please discharge patient when stable, will follow up today with Dr. Amaris Garrette at the [facility] [place] office immediately following discharge.  Leave shield in place until visit.  All paperwork with discharge instructions will be given at the office.  [facility] [place] Address:  [address]  [place], [place] [address]  

## 2019-08-19 NOTE — Op Note (Signed)
Date of procedure: 08/19/19  Pre-operative diagnosis: Visually significant age-related combined cataract, Left Eye (H25.812)  Post-operative diagnosis: Visually significant age-related combined cataract, Left Eye (H25.812)  Procedure: Removal of cataract via phacoemulsification and insertion of intra-ocular lens Wynetta Emery and [place]  +23.5D into the capsular bag of the Left Eye  Attending surgeon: Gerda Diss. Shmiel Morton, MD, MA  Anesthesia: MAC, Topical Akten  Complications: None  Estimated Blood Loss: <18m (minimal)  Specimens: None  Implants: As above  Indications:  Visually significant age-related cataract, Left Eye  Procedure:  The patient was seen and identified in the pre-operative area. The operative eye was identified and dilated.  The operative eye was marked.  Topical anesthesia was administered to the operative eye.     The patient was then to the operative suite and placed in the supine position.  A timeout was performed confirming the patient, procedure to be performed, and all other relevant information.   The patient's face was prepped and draped in the usual fashion for intra-ocular surgery.  A lid speculum was placed into the operative eye and the surgical microscope moved into place and focused.  An inferotemporal paracentesis was created using a 20 gauge paracentesis blade.  Shugarcaine was injected into the anterior chamber.  Viscoelastic was injected into the anterior chamber.  A temporal clear-corneal main wound incision was created using a 2.447mmicrokeratome.  A continuous curvilinear capsulorrhexis was initiated using an irrigating cystitome and completed using capsulorrhexis forceps.  Hydrodissection and hydrodeliniation were performed.  Viscoelastic was injected into the anterior chamber.  A phacoemulsification handpiece and a chopper as a second instrument were used to remove the nucleus and epinucleus. The irrigation/aspiration handpiece was used to remove  any remaining cortical material.   The capsular bag was reinflated with viscoelastic, checked, and found to be intact.  The intraocular lens was inserted into the capsular bag.  The irrigation/aspiration handpiece was used to remove any remaining viscoelastic.  The clear corneal wound and paracentesis wounds were then hydrated and checked with Weck-Cels to be watertight.  The lid-speculum and drape was removed, and the patient's face was cleaned with a wet and dry 4x4.  Maxitrol was instilled in the eye before a clear shield was taped over the eye. The patient was taken to the post-operative care unit in good condition, having tolerated the procedure well.  Post-Op Instructions: [facility] and will receive all other orders and instructions.

## 2019-08-19 NOTE — Transfer of Care (Signed)
Immediate Anesthesia Transfer of Care Note  Patient: Anna [name]  Procedure(s) Performed: CATARACT EXTRACTION PHACO AND INTRAOCULAR LENS PLACEMENT (IOC) (Left Eye)  Patient Location: PACU  Anesthesia Type:MAC  Level of Consciousness: awake, alert , oriented and patient cooperative  Airway & Oxygen Therapy: Patient Spontanous Breathing  Post-op Assessment: Report given to RN, Post -op Vital signs reviewed and stable and Patient moving all extremities X 4  Post vital signs: Reviewed and stable  Last Vitals:  Vitals Value Taken Time  BP    Temp    Pulse    Resp    SpO2      Last Pain:  Vitals:   08/19/19 1037  TempSrc: Oral  PainSc: 0-No pain      Patients Stated Pain Goal: 6 (56/43/32 9518)  Complications: No apparent anesthesia complications

## 2019-08-27 ENCOUNTER — Ambulatory Visit: Payer: PPO | Attending: Internal Medicine

## 2019-08-27 DIAGNOSIS — Z23 Encounter for immunization: Secondary | ICD-10-CM

## 2019-08-27 NOTE — Progress Notes (Signed)
   Covid-19 Vaccination Clinic  Name:  LEEASIA SECRIST    MRN: [id number] DOB: [birth year]  08/27/2019  Ms. Tsui was observed post Covid-19 immunization for 15 minutes without incident. She was provided with Vaccine Information Sheet and instruction to access the V-Safe system.   Ms. Hornung was instructed to call 911 with any severe reactions post vaccine: Marland Kitchen Difficulty breathing  . Swelling of face and throat  . A fast heartbeat  . A bad rash all over body  . Dizziness and weakness   Immunizations Administered    Name Date Dose VIS Date Route   Pfizer COVID-19 Vaccine 08/27/2019 11:42 AM 0.3 mL 05/10/2019 Intramuscular   Manufacturer: [employer], [employer]   Lot: [id number]   NDC: [address]-7

## 2019-08-28 NOTE — H&P (Signed)
Surgical History & Physical  Patient Name: Anna [name] DOB: May 13, 1946  Surgery: Cataract extraction with intraocular lens implant phacoemulsification; Right Eye  Surgeon: Fabio Pierce MD Surgery Date:  09/09/2019 Pre-Op Date:  08/26/2019  HPI: A 30 Yr. old female patient 1. 1. The patient complains of difficulty when viewing TV, reading closed caption, news scrolls on TV, which began 1 year ago. The right eye is affected. The episode is gradual. The condition's severity increased since last visit. Symptoms occur when the patient is inside and outside. This is negatively affecting the patient's quality of life. 2. The patient is returning after cataract post-op. The left eye is affected. Status post cataract post-op, 1 week ago: Since the last visit, the affected area is doing well. The patient's vision is improved. Patient is following medication instructions.  Medical History: Glaucoma Cataracts Diabetes High Blood Pressure LDL back pain  Review of Systems Negative Allergic/Immunologic Negative Cardiovascular Negative Constitutional Negative Ear, Nose, Mouth & Throat Negative Endocrine Negative Eyes Negative Gastrointestinal Negative Genitourinary Negative Hemotologic/Lymphatic Negative Integumentary Negative Musculoskeletal Negative Neurological Negative Psychiatry Negative Respiratory  Social   Never smoked   Medication Soothe XP, Prednisolone-gatiflox-bromfenac,  Losartan-Hydrochlorothiazide, Additional fluid pill, Garlic, Glucosamine Sulfate, Red yeast rice, Tylenol, Omega 3,   Sx/Procedures SLT OU 2006, Phaco c IOL OS,  Hysterectomy,   Drug Allergies  Lisinopril,   History & Physical: Heent:  Cataract, Right eye NECK: supple without bruits LUNGS: lungs clear to auscultation CV: regular rate and rhythm Abdomen: soft and non-tender   Impression & Plan: Assessment: 1.  CATARACT EXTRACTION STATUS; Left Eye (Z98.42) 2.  COMBINED FORMS AGE RELATED  CATARACT; , Right Eye (H25.811)  Plan: 1.  1 week after cataract surgery. Doing well with improved vision and normal eye pressure. Call with any problems or concerns. Continue Gati-Brom-Pred 2x/day for 3 more weeks. 2.  Cataract accounts for the patient's decreased vision. This visual impairment is not correctable with a tolerable change in glasses or contact lenses. Cataract surgery with an implantation of a new lens should significantly improve the visual and functional status of the patient. Discussed all risks, benefits, alternatives, and potential complications. Discussed the procedures and recovery. Patient desires to have surgery. A-scan ordered and performed today for intra-ocular lens calculations. The surgery will be performed in order to improve vision for driving, reading, and for eye examinations. Recommend phacoemulsification with intra-ocular lens. Right Eye. Surgery required to correct imbalance of vision. Dilates well - shugarcaine by protocol.

## 2019-09-02 DIAGNOSIS — H25811 Combined forms of age-related cataract, right eye: Secondary | ICD-10-CM | POA: Diagnosis not present

## 2019-09-04 ENCOUNTER — Encounter (HOSPITAL_COMMUNITY): Payer: Self-pay

## 2019-09-04 ENCOUNTER — Other Ambulatory Visit: Payer: Self-pay

## 2019-09-04 ENCOUNTER — Encounter (HOSPITAL_COMMUNITY)
Admission: RE | Admit: 2019-09-04 | Discharge: 2019-09-04 | Disposition: A | Discharge status: Home / Self Care | Payer: PPO | Source: Ambulatory Visit | Attending: Ophthalmology | Admitting: Ophthalmology

## 2019-09-06 ENCOUNTER — Other Ambulatory Visit (HOSPITAL_COMMUNITY)
Admission: RE | Admit: 2019-09-06 | Discharge: 2019-09-06 | Disposition: A | Discharge status: Home / Self Care | Payer: PPO | Source: Ambulatory Visit | Attending: Ophthalmology | Admitting: Ophthalmology

## 2019-09-06 ENCOUNTER — Other Ambulatory Visit: Payer: Self-pay

## 2019-09-06 DIAGNOSIS — Z01812 Encounter for preprocedural laboratory examination: Secondary | ICD-10-CM | POA: Insufficient documentation

## 2019-09-06 DIAGNOSIS — Z20822 Contact with and (suspected) exposure to covid-19: Secondary | ICD-10-CM | POA: Insufficient documentation

## 2019-09-07 LAB — SARS CORONAVIRUS 2 (TAT 6-24 HRS): SARS Coronavirus 2: NEGATIVE

## 2019-09-09 ENCOUNTER — Encounter (HOSPITAL_COMMUNITY): Payer: Self-pay | Admitting: Ophthalmology

## 2019-09-09 ENCOUNTER — Ambulatory Visit (HOSPITAL_COMMUNITY)
Admission: RE | Admit: 2019-09-09 | Discharge: 2019-09-09 | Disposition: A | Discharge status: Home / Self Care | Payer: PPO | Attending: Ophthalmology | Admitting: Ophthalmology

## 2019-09-09 ENCOUNTER — Encounter (HOSPITAL_COMMUNITY)
Admission: RE | Disposition: A | Discharge status: Home / Self Care | Payer: Self-pay | Source: Home / Self Care | Attending: Ophthalmology

## 2019-09-09 ENCOUNTER — Ambulatory Visit (HOSPITAL_COMMUNITY): Payer: PPO | Admitting: Anesthesiology

## 2019-09-09 DIAGNOSIS — Z9842 Cataract extraction status, left eye: Secondary | ICD-10-CM | POA: Diagnosis not present

## 2019-09-09 DIAGNOSIS — E1136 Type 2 diabetes mellitus with diabetic cataract: Secondary | ICD-10-CM | POA: Insufficient documentation

## 2019-09-09 DIAGNOSIS — I10 Essential (primary) hypertension: Secondary | ICD-10-CM | POA: Insufficient documentation

## 2019-09-09 DIAGNOSIS — H409 Unspecified glaucoma: Secondary | ICD-10-CM | POA: Diagnosis not present

## 2019-09-09 DIAGNOSIS — H25811 Combined forms of age-related cataract, right eye: Secondary | ICD-10-CM | POA: Diagnosis not present

## 2019-09-09 DIAGNOSIS — Z888 Allergy status to other drugs, medicaments and biological substances status: Secondary | ICD-10-CM | POA: Insufficient documentation

## 2019-09-09 DIAGNOSIS — Z79899 Other long term (current) drug therapy: Secondary | ICD-10-CM | POA: Diagnosis not present

## 2019-09-09 HISTORY — PX: CATARACT EXTRACTION W/PHACO: SHX586

## 2019-09-09 SURGERY — PHACOEMULSIFICATION, CATARACT, WITH IOL INSERTION
Anesthesia: Monitor Anesthesia Care | Site: Eye | Laterality: Right

## 2019-09-09 MED ORDER — LIDOCAINE HCL 3.5 % OP GEL
1.0000 "application " | Freq: Once | OPHTHALMIC | Status: AC
  Administered 2019-09-09: 1 via OPHTHALMIC

## 2019-09-09 MED ORDER — CYCLOPENTOLATE-PHENYLEPHRINE 0.2-1 % OP SOLN
1.0000 [drp] | OPHTHALMIC | Status: AC | PRN
  Administered 2019-09-09 (×3): 1 [drp] via OPHTHALMIC

## 2019-09-09 MED ORDER — TETRACAINE HCL 0.5 % OP SOLN
1.0000 [drp] | OPHTHALMIC | Status: AC | PRN
  Administered 2019-09-09 (×3): 1 [drp] via OPHTHALMIC

## 2019-09-09 MED ORDER — SODIUM HYALURONATE 23 MG/ML IO SOLN
INTRAOCULAR | Status: DC | PRN
  Administered 2019-09-09: 0.6 mL via INTRAOCULAR

## 2019-09-09 MED ORDER — PHENYLEPHRINE HCL 2.5 % OP SOLN
1.0000 [drp] | OPHTHALMIC | Status: AC | PRN
  Administered 2019-09-09 (×3): 1 [drp] via OPHTHALMIC

## 2019-09-09 MED ORDER — TRYPAN BLUE 0.06 % OP SOLN
OPHTHALMIC | Status: DC | PRN
  Administered 2019-09-09: 0.5 mL via INTRAOCULAR

## 2019-09-09 MED ORDER — TRYPAN BLUE 0.06 % OP SOLN
OPHTHALMIC | Status: AC
  Filled 2019-09-09: qty 0.5

## 2019-09-09 MED ORDER — EPINEPHRINE PF 1 MG/ML IJ SOLN
INTRAMUSCULAR | Status: AC
  Filled 2019-09-09: qty 2

## 2019-09-09 MED ORDER — NEOMYCIN-POLYMYXIN-DEXAMETH 3.5-10000-0.1 OP SUSP
OPHTHALMIC | Status: DC | PRN
  Administered 2019-09-09: 1 [drp] via OPHTHALMIC

## 2019-09-09 MED ORDER — PROVISC 10 MG/ML IO SOLN
INTRAOCULAR | Status: DC | PRN
  Administered 2019-09-09: 0.85 mL via INTRAOCULAR

## 2019-09-09 MED ORDER — LIDOCAINE HCL (PF) 1 % IJ SOLN
INTRAOCULAR | Status: DC | PRN
  Administered 2019-09-09: 09:00:00 1 mL via OPHTHALMIC

## 2019-09-09 MED ORDER — BSS IO SOLN
INTRAOCULAR | Status: DC | PRN
  Administered 2019-09-09: 15 mL via INTRAOCULAR

## 2019-09-09 MED ORDER — POVIDONE-IODINE 5 % OP SOLN
OPHTHALMIC | Status: DC | PRN
  Administered 2019-09-09: 1 via OPHTHALMIC

## 2019-09-09 MED ORDER — EPINEPHRINE PF 1 MG/ML IJ SOLN
INTRAOCULAR | Status: DC | PRN
  Administered 2019-09-09: 09:00:00 500 mL

## 2019-09-09 SURGICAL SUPPLY — 15 items

## 2019-09-09 NOTE — Anesthesia Preprocedure Evaluation (Addendum)
Anesthesia Evaluation  Patient identified by MRN, date of birth, ID band Patient awake    Reviewed: Allergy & Precautions, NPO status , Patient's Chart, lab work & pertinent test results, Unable to perform ROS - Chart review only  History of Anesthesia Complications Negative for: history of anesthetic complications  Airway Mallampati: II  TM Distance: >3 FB Neck ROM: Full    Dental  (+) Missing   Pulmonary neg pulmonary ROS,    Pulmonary exam normal breath sounds clear to auscultation       Cardiovascular Exercise Tolerance: Good hypertension, Pt. on medications Normal cardiovascular exam Rhythm:Regular  Left ventricle: The cavity size was normal. Wall thickness was  increased in a pattern of mild LVH. Systolic function was  vigorous. The estimated ejection fraction was in the range of 65%  to 70%. Wall motion was normal; there were no regional wall  motion abnormalities. Doppler parameters are consistent with  abnormal left ventricular relaxation (grade 1 diastolic  dysfunction). Doppler parameters are consistent with high  ventricular filling pressure.  - Aortic valve: Mildly calcified annulus. Trileaflet; mildly  thickened leaflets. Sclerosis without stenosis.  - Mitral valve: Mildly thickened leaflets .  - Left atrium: The atrium was mildly dilated.    Neuro/Psych negative neurological ROS  negative psych ROS   GI/Hepatic negative GI ROS, Neg liver ROS,   Endo/Other  diabetes, Well Controlled, Type 2  Renal/GU negative Renal ROS  negative genitourinary   Musculoskeletal   Abdominal   Peds negative pediatric ROS (+)  Hematology  (+) Blood dyscrasia, anemia ,   Anesthesia Other Findings   Reproductive/Obstetrics                           Anesthesia Physical  Anesthesia Plan  ASA: II  Anesthesia Plan: MAC   Post-op Pain Management:    Induction:   PONV Risk  Score and Plan: 2 and Treatment may vary due to age or medical condition  Airway Management Planned: Nasal Cannula and Natural Airway  Additional Equipment:   Intra-op Plan:   Post-operative Plan:   Informed Consent: I have reviewed the patients History and Physical, chart, labs and discussed the procedure including the risks, benefits and alternatives for the proposed anesthesia with the patient or authorized representative who has indicated his/her understanding and acceptance.       Plan Discussed with: CRNA and Surgeon  Anesthesia Plan Comments:        Anesthesia Quick Evaluation

## 2019-09-09 NOTE — Discharge Instructions (Signed)
Please discharge patient when stable, will follow up today with Dr. Stellarose Cerny at the [facility] office immediately following discharge.  Leave shield in place until visit.  All paperwork with discharge instructions will be given at the office.  [facility] Address:  [address]  [place], [place] [address]  

## 2019-09-09 NOTE — Op Note (Signed)
Date of procedure: 09/09/19  Pre-operative diagnosis:  Visually significant combined form age-related cataract, Right Eye (H25.811)  Post-operative diagnosis:  Visually significant combined form age-related cataract, Right Eye (H25.811)  Procedure: Removal of cataract via phacoemulsification and insertion of intra-ocular lens Wynetta Emery and [employer] DCB00  +23.5D into the capsular bag of the Right Eye  Attending surgeon: Gerda Diss. Danee Soller, MD, MA  Anesthesia: MAC, Topical Akten  Complications: None  Estimated Blood Loss: <83m (minimal)  Specimens: None  Implants: As above  Indications:  Visually significant age-related cataract, Right Eye  Procedure:  The patient was seen and identified in the pre-operative area. The operative eye was identified and dilated.  The operative eye was marked.  Topical anesthesia was administered to the operative eye.     The patient was then to the operative suite and placed in the supine position.  A timeout was performed confirming the patient, procedure to be performed, and all other relevant information.   The patient's face was prepped and draped in the usual fashion for intra-ocular surgery.  A lid speculum was placed into the operative eye and the surgical microscope moved into place and focused.  A superotemporal paracentesis was created using a 20 gauge paracentesis blade.  Shugarcaine was injected into the anterior chamber.  Viscoelastic was injected into the anterior chamber.  A temporal clear-corneal main wound incision was created using a 2.438mmicrokeratome.  A continuous curvilinear capsulorrhexis was initiated using an irrigating cystitome and completed using capsulorrhexis forceps.  Hydrodissection and hydrodeliniation were performed.  Viscoelastic was injected into the anterior chamber.  A phacoemulsification handpiece and a chopper as a second instrument were used to remove the nucleus and epinucleus. The irrigation/aspiration handpiece was  used to remove any remaining cortical material.   The capsular bag was reinflated with viscoelastic, checked, and found to be intact.  The intraocular lens was inserted into the capsular bag.  The irrigation/aspiration handpiece was used to remove any remaining viscoelastic.  The clear corneal wound and paracentesis wounds were then hydrated and checked with Weck-Cels to be watertight.  The lid-speculum was removed.  The drape was removed.  The patient's face was cleaned with a wet and dry 4x4.   Maxitrol was instilled in the eye before a clear shield was taped over the eye. The patient was taken to the post-operative care unit in good condition, having tolerated the procedure well.  Post-Op Instructions: [facility] and will receive all other orders and instructions.

## 2019-09-09 NOTE — Transfer of Care (Signed)
Immediate Anesthesia Transfer of Care Note  Patient: Anna [name]  Procedure(s) Performed: CATARACT EXTRACTION PHACO AND INTRAOCULAR LENS PLACEMENT (IOC) (Right Eye)  Patient Location: Short Stay  Anesthesia Type:MAC  Level of Consciousness: awake, alert , oriented and patient cooperative  Airway & Oxygen Therapy: Patient Spontanous Breathing  Post-op Assessment: Report given to RN and Post -op Vital signs reviewed and stable  Post vital signs: Reviewed and stable  Last Vitals:  Vitals Value Taken Time  BP    Temp    Pulse    Resp    SpO2      Last Pain:  Vitals:   09/09/19 0815  TempSrc: Oral  PainSc: 0-No pain      Patients Stated Pain Goal: 9 (29/56/21 3086)  Complications: No apparent anesthesia complications

## 2019-09-09 NOTE — Anesthesia Postprocedure Evaluation (Signed)
Anesthesia Post Note  Patient: [name]  Procedure(s) Performed: CATARACT EXTRACTION PHACO AND INTRAOCULAR LENS PLACEMENT (IOC) (Right Eye)  Patient location during evaluation: Short Stay Anesthesia Type: MAC Level of consciousness: awake and alert Pain management: pain level controlled Vital Signs Assessment: post-procedure vital signs reviewed and stable Respiratory status: spontaneous breathing Cardiovascular status: stable Postop Assessment: no apparent nausea or vomiting and adequate PO intake Anesthetic complications: no     Last Vitals:  Vitals:   09/09/19 0815 09/09/19 0924  BP: (!) 163/53   Pulse:  (!) 57  Resp: (!) 24 18  Temp: 36.6 C 36.7 C  SpO2: 99%     Last Pain:  Vitals:   09/09/19 0924  TempSrc:   PainSc: 0-No pain                 Everette Rank

## 2019-09-09 NOTE — Interval H&P Note (Signed)
History and Physical Interval Note: The H and P was reviewed and updated. The patient was examined.  No changes were found after exam.  The surgical eye was marked.  09/09/2019 8:59 AM  Anna [name]  has presented today for surgery, with the diagnosis of Nuclear sclerotic cataract - Right eye.  The various methods of treatment have been discussed with the patient and family. After consideration of risks, benefits and other options for treatment, the patient has consented to  Procedure(s) with comments: CATARACT EXTRACTION PHACO AND INTRAOCULAR LENS PLACEMENT (IOC) (Right) - CDE:  as a surgical intervention.  The patient's history has been reviewed, patient examined, no change in status, stable for surgery.  I have reviewed the patient's chart and labs.  Questions were answered to the patient's satisfaction.     Fabio Pierce

## 2019-09-11 NOTE — Addendum Note (Signed)
Addendum  created 09/11/19 0928 by Franco Nones, CRNA   Charge Capture section accepted

## 2019-10-08 DIAGNOSIS — E1129 Type 2 diabetes mellitus with other diabetic kidney complication: Secondary | ICD-10-CM | POA: Diagnosis not present

## 2019-10-11 DIAGNOSIS — E049 Nontoxic goiter, unspecified: Secondary | ICD-10-CM | POA: Diagnosis not present

## 2019-10-11 DIAGNOSIS — I10 Essential (primary) hypertension: Secondary | ICD-10-CM | POA: Diagnosis not present

## 2019-10-11 DIAGNOSIS — Z6833 Body mass index (BMI) 33.0-33.9, adult: Secondary | ICD-10-CM | POA: Diagnosis not present

## 2019-10-11 DIAGNOSIS — R0602 Shortness of breath: Secondary | ICD-10-CM | POA: Diagnosis not present

## 2019-10-11 DIAGNOSIS — E1129 Type 2 diabetes mellitus with other diabetic kidney complication: Secondary | ICD-10-CM | POA: Diagnosis not present

## 2019-10-14 ENCOUNTER — Other Ambulatory Visit (HOSPITAL_COMMUNITY): Payer: Self-pay | Admitting: Internal Medicine

## 2019-10-14 DIAGNOSIS — R221 Localized swelling, mass and lump, neck: Secondary | ICD-10-CM

## 2019-10-14 DIAGNOSIS — E049 Nontoxic goiter, unspecified: Secondary | ICD-10-CM

## 2019-10-25 ENCOUNTER — Ambulatory Visit (HOSPITAL_COMMUNITY)
Admission: RE | Admit: 2019-10-25 | Discharge: 2019-10-25 | Disposition: A | Discharge status: Home / Self Care | Payer: PPO | Source: Ambulatory Visit | Attending: Internal Medicine | Admitting: Internal Medicine

## 2019-10-25 ENCOUNTER — Other Ambulatory Visit: Payer: Self-pay

## 2019-10-25 ENCOUNTER — Encounter (HOSPITAL_COMMUNITY): Payer: Self-pay

## 2019-10-25 DIAGNOSIS — E049 Nontoxic goiter, unspecified: Secondary | ICD-10-CM

## 2019-10-25 DIAGNOSIS — R221 Localized swelling, mass and lump, neck: Secondary | ICD-10-CM

## 2019-10-25 MED ORDER — IOHEXOL 300 MG/ML  SOLN: 75.0000 mL | Freq: Once | INTRAMUSCULAR | Status: DC | PRN

## 2019-10-29 ENCOUNTER — Ambulatory Visit (HOSPITAL_COMMUNITY)
Admission: RE | Admit: 2019-10-29 | Discharge: 2019-10-29 | Disposition: A | Discharge status: Home / Self Care | Payer: PPO | Source: Ambulatory Visit | Attending: Internal Medicine | Admitting: Internal Medicine

## 2019-10-29 ENCOUNTER — Other Ambulatory Visit: Payer: Self-pay

## 2019-10-29 DIAGNOSIS — R221 Localized swelling, mass and lump, neck: Secondary | ICD-10-CM | POA: Diagnosis not present

## 2019-10-29 DIAGNOSIS — E049 Nontoxic goiter, unspecified: Secondary | ICD-10-CM | POA: Insufficient documentation

## 2019-10-29 LAB — POCT I-STAT CREATININE: Creatinine, Ser: 1.2 mg/dL — ABNORMAL HIGH (ref 0.44–1.00)

## 2019-10-29 MED ORDER — IOHEXOL 300 MG/ML  SOLN
75.0000 mL | Freq: Once | INTRAMUSCULAR | Status: AC | PRN
  Administered 2019-10-29: 75 mL via INTRAVENOUS

## 2019-11-08 ENCOUNTER — Other Ambulatory Visit (HOSPITAL_COMMUNITY): Payer: Self-pay | Admitting: Internal Medicine

## 2019-11-08 DIAGNOSIS — Z1231 Encounter for screening mammogram for malignant neoplasm of breast: Secondary | ICD-10-CM

## 2019-11-21 ENCOUNTER — Encounter: Payer: Self-pay | Admitting: *Deleted

## 2019-11-21 ENCOUNTER — Other Ambulatory Visit: Payer: Self-pay

## 2019-11-21 ENCOUNTER — Other Ambulatory Visit: Payer: Self-pay | Admitting: Thoracic Surgery (Cardiothoracic Vascular Surgery)

## 2019-11-21 ENCOUNTER — Institutional Professional Consult (permissible substitution): Payer: PPO | Admitting: Thoracic Surgery (Cardiothoracic Vascular Surgery)

## 2019-11-21 VITALS — BP 179/69 | HR 62 | Temp 97.2°F | Resp 18 | Ht 61.0 in | Wt 179.0 lb

## 2019-11-21 DIAGNOSIS — K2289 Other specified disease of esophagus: Secondary | ICD-10-CM

## 2019-11-21 DIAGNOSIS — E890 Postprocedural hypothyroidism: Secondary | ICD-10-CM

## 2019-11-21 DIAGNOSIS — K228 Other specified diseases of esophagus: Secondary | ICD-10-CM

## 2019-11-21 DIAGNOSIS — E049 Nontoxic goiter, unspecified: Secondary | ICD-10-CM

## 2019-11-21 DIAGNOSIS — Z9009 Acquired absence of other part of head and neck: Secondary | ICD-10-CM

## 2019-11-21 NOTE — Progress Notes (Signed)
[address]       [place] [address]             [phone]                    Anna [name] [facility] Medical Record #[id number] Date of Birth: 09/25/1945  Referring: Carylon Perches, MD Primary Care: Carylon Perches, MD Primary Cardiologist: No primary care provider on file.  Chief Complaint:    Chief Complaint  Patient presents with  . Goiter    Surgical consult, CT Neck 10/29/19, PFT's 07/05/19    History of Present Illness:    IMO CUMBIE 74 y.o. 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  When she was 26 or 74 years old she underwent a partial thyroidectomy which did not reveal any cancer.  She does state that she has had fullness in her left neck even after that point.  Over the past several years she had noted some enlarged veins along her chest and neck.  Over the last year she has developed some shortness of breath and a chronic cough.  She denies any weight changes.  She has had some drainage from soft tissue nodule in between her breasts.   She presents today with a friend.  Her husband has severe COPD and she is his main caretaker.  The only other family member is a niece who lives outside of the state.   Past Medical History:  Diagnosis Date  . Anemia   . Aortic valve disorder   . HTN (hypertension)   . Hypercholesteremia   . Hyperlipidemia   . Hypertension   . Nodular goiter     Past Surgical History:  Procedure Laterality Date  . ABDOMINAL HYSTERECTOMY    . CATARACT EXTRACTION W/PHACO Left 08/19/2019   Procedure: CATARACT EXTRACTION PHACO AND INTRAOCULAR LENS PLACEMENT (IOC);  Surgeon: Fabio Pierce, MD;  Location: [facility];  Service: Ophthalmology;  Laterality: Left;  CDE: 5.18  . CATARACT EXTRACTION W/PHACO Right 09/09/2019   Procedure: CATARACT EXTRACTION PHACO AND INTRAOCULAR LENS PLACEMENT (IOC);  Surgeon: Fabio Pierce, MD;  Location: [facility];  Service:  Ophthalmology;  Laterality: Right;  CDE: 4.49  . COLONOSCOPY    . COLONOSCOPY  11/22/2011   Procedure: COLONOSCOPY;  Surgeon: Dalia Heading, MD;  Location: [facility];  Service: Gastroenterology;  Laterality: N/A;  . COLONOSCOPY N/A 02/27/2018   Procedure: COLONOSCOPY;  Surgeon: Franky Macho, MD;  Location: [facility];  Service: Gastroenterology;  Laterality: N/A;  . Goiter removed    . THYROIDECTOMY, PARTIAL Right 1978    Family History  Problem Relation Age of Onset  . Colon cancer Father   . Colon cancer Sister      Social History   Tobacco Use  Smoking Status Never Smoker  Smokeless Tobacco Never Used    Social History   Substance and Sexual Activity  Alcohol Use No     Allergies  Allergen Reactions  . Lisinopril Cough  . Other Itching    Wool cloth    Current Outpatient Medications  Medication Sig Dispense Refill  . acetaminophen (TYLENOL) 500 MG tablet Take 500-1,500 mg by mouth every 6 (six) hours as needed for moderate pain (for pain/headache).     Marland Kitchen amLODipine (NORVASC) 5 MG tablet Take 5 mg by mouth at bedtime.    Marland Kitchen aspirin EC 81 MG tablet Take 81 mg  by mouth at bedtime.    . Cholecalciferol (VITAMIN D3) 1000 UNITS CAPS Take 1,000 Units by mouth 3 (three) times a week.     . losartan-hydrochlorothiazide (HYZAAR) 100-25 MG per tablet Take 1 tablet by mouth daily.    . Multiple Vitamin (MULTIVITAMIN WITH MINERALS) TABS tablet Take 1 tablet by mouth daily. Alive for Women 50+    . Multiple Vitamins-Minerals (EMERGEN-C IMMUNE PO) Take 1 packet by mouth daily as needed (immune support).    . Multiple Vitamins-Minerals (HAIR/SKIN/NAILS/BIOTIN PO) Take 1 tablet by mouth daily.    . Red Yeast Rice 600 MG CAPS Take 1,200 mg by mouth at bedtime.     . trolamine salicylate (ASPERCREME) 10 % cream Apply 1 application topically 4 (four) times daily as needed for muscle pain.    . TURMERIC PO Take 1 drop by mouth daily. Liquid mixed in water     No current  facility-administered medications for this visit.    Review of Systems  Constitutional: Negative for chills, fever, malaise/fatigue and weight loss.  HENT: Positive for sore throat.   Eyes: Negative.   Respiratory: Positive for cough, shortness of breath and stridor.   Cardiovascular: Negative for chest pain.  Gastrointestinal: Negative.   Musculoskeletal: Negative.   Neurological: Negative.      PHYSICAL EXAMINATION: BP (!) 179/69 (BP Location: Right Arm, Patient Position: Sitting, Cuff Size: Large)   Pulse 62   Temp (!) 97.2 F (36.2 C)   Resp 18   Ht 5\' 1"  (1.549 m)   Wt 179 lb (81.2 kg)   SpO2 100% Comment: RA  BMI 33.82 kg/m  Physical Exam  HENT:  Head: Normocephalic.  Eyes: Conjunctivae are normal.  Neck:    Increased varicosity along the neck bilaterally and upper chest. Large mostly left-sided thyroid goiter extending down below the sternum. Collar incision  Cardiovascular: Normal rate.  Respiratory: Stridor present. No respiratory distress.  GI: Normal appearance. She exhibits no distension.  Musculoskeletal:        General: Normal range of motion.     Cervical back: Normal range of motion.  Neurological: She is alert.  Skin: Skin is warm and dry.  Psychiatric: Mood normal.    Diagnostic Studies & Laboratory data:    CT Scan:  IMPRESSION: Extremely large goiter on the left measuring 7.7 x 5.7 x 15 cm. Goiter extends into the superior mediastinum down to the level of the carina. There is enhancing tissue within the esophagus lumen contiguous with the goiter suggesting invasion of the esophagus. This may represent malignant transformation of the goiter and biopsy recommended. There is moderate airway narrowing of the trachea. No enlarged lymph nodes identified.      I have independently reviewed the above radiology studies  and reviewed the findings with the patient.   Recent Lab Findings: Lab Results  Component Value Date   GLUCOSE 115 (H)  08/14/2019   NA 138 08/14/2019   K 3.6 08/14/2019   CL 102 08/14/2019   CREATININE 1.20 (H) 10/29/2019   BUN 16 08/14/2019   CO2 27 08/14/2019    Assessment / Plan:   74 year old female who presents with an enlarged thyroid goiter with substernal extension.  Based off of her imaging there is concern that there may be invasion into the mid esophagus.  There is concern that this this may be a malignant transformation of her thyroid goiter given its appearance on CT scan.  I have ordered a PET/CT as well as a thyroid  uptake scan.  She will then undergo an EGD for further evaluation of her esophagus.  I will also speak with Dr. Harlow Asa and will refer her to him for further evaluation as well.  I explained to her that this is a very concerning mass.  If there is truly invasion of her esophagus, the only options would be a combined procedure were these esophagus is resected and reconstructed with a gastric conduit.  I do not think that she would be a good candidate for any chemotherapy or radiation given that this will likely result in erosion of the esophagus which would be very difficult to address.  I  spent 40 minutes with  the patient face to face and greater then 50% of the time was spent in counseling and coordination of care.    Lajuana Matte 11/21/2019 11:50 AM

## 2019-11-25 ENCOUNTER — Encounter: Payer: Self-pay | Admitting: *Deleted

## 2019-11-25 ENCOUNTER — Other Ambulatory Visit: Payer: Self-pay | Admitting: *Deleted

## 2019-11-25 DIAGNOSIS — E049 Nontoxic goiter, unspecified: Secondary | ICD-10-CM

## 2019-11-28 ENCOUNTER — Other Ambulatory Visit: Payer: Self-pay | Admitting: *Deleted

## 2019-12-03 ENCOUNTER — Telehealth: Payer: Self-pay

## 2019-12-03 NOTE — Telephone Encounter (Signed)
Patient and niece, Alona Bene contacted the office about plan of care and future appointment clarification.  Patient was advised to keep appointments of PET and Thyroid uptake scans as scheduled and she would be receiving a phone call from [facility] for surgery consultation.  Patient and niece expressed understanding and is aware of all appointment's.

## 2019-12-04 ENCOUNTER — Ambulatory Visit (HOSPITAL_COMMUNITY)
Admission: RE | Admit: 2019-12-04 | Discharge: 2019-12-04 | Disposition: A | Discharge status: Home / Self Care | Payer: PPO | Source: Ambulatory Visit | Attending: Thoracic Surgery (Cardiothoracic Vascular Surgery) | Admitting: Thoracic Surgery (Cardiothoracic Vascular Surgery)

## 2019-12-04 ENCOUNTER — Other Ambulatory Visit: Payer: Self-pay

## 2019-12-04 DIAGNOSIS — E049 Nontoxic goiter, unspecified: Secondary | ICD-10-CM

## 2019-12-04 DIAGNOSIS — J398 Other specified diseases of upper respiratory tract: Secondary | ICD-10-CM | POA: Diagnosis not present

## 2019-12-04 LAB — GLUCOSE, CAPILLARY: Glucose-Capillary: 131 mg/dL — ABNORMAL HIGH (ref 70–99)

## 2019-12-04 MED ORDER — FLUDEOXYGLUCOSE F - 18 (FDG) INJECTION
8.9300 | Freq: Once | INTRAVENOUS | Status: AC
  Administered 2019-12-04: 8.93 via INTRAVENOUS

## 2019-12-09 ENCOUNTER — Encounter (HOSPITAL_COMMUNITY)
Admission: RE | Admit: 2019-12-09 | Discharge: 2019-12-09 | Disposition: A | Discharge status: Home / Self Care | Payer: PPO | Source: Ambulatory Visit | Attending: Thoracic Surgery (Cardiothoracic Vascular Surgery) | Admitting: Thoracic Surgery (Cardiothoracic Vascular Surgery)

## 2019-12-09 ENCOUNTER — Other Ambulatory Visit: Payer: Self-pay

## 2019-12-09 DIAGNOSIS — E049 Nontoxic goiter, unspecified: Secondary | ICD-10-CM | POA: Diagnosis not present

## 2019-12-09 MED ORDER — SODIUM IODIDE I-123 7.4 MBQ CAPS
477.0000 | ORAL_CAPSULE | Freq: Once | ORAL | Status: AC
  Administered 2019-12-09: 477 via ORAL

## 2019-12-10 ENCOUNTER — Encounter (HOSPITAL_COMMUNITY)
Admission: RE | Admit: 2019-12-10 | Discharge: 2019-12-10 | Disposition: A | Discharge status: Home / Self Care | Payer: PPO | Source: Ambulatory Visit | Attending: Thoracic Surgery (Cardiothoracic Vascular Surgery) | Admitting: Thoracic Surgery (Cardiothoracic Vascular Surgery)

## 2019-12-10 DIAGNOSIS — E049 Nontoxic goiter, unspecified: Secondary | ICD-10-CM | POA: Diagnosis not present

## 2019-12-12 ENCOUNTER — Other Ambulatory Visit (HOSPITAL_COMMUNITY): Payer: PPO

## 2019-12-13 DIAGNOSIS — E041 Nontoxic single thyroid nodule: Secondary | ICD-10-CM | POA: Diagnosis not present

## 2019-12-13 DIAGNOSIS — E042 Nontoxic multinodular goiter: Secondary | ICD-10-CM | POA: Diagnosis not present

## 2019-12-13 DIAGNOSIS — E049 Nontoxic goiter, unspecified: Secondary | ICD-10-CM | POA: Diagnosis not present

## 2019-12-13 DIAGNOSIS — Z789 Other specified health status: Secondary | ICD-10-CM | POA: Diagnosis not present

## 2019-12-13 DIAGNOSIS — J9859 Other diseases of mediastinum, not elsewhere classified: Secondary | ICD-10-CM | POA: Diagnosis not present

## 2019-12-13 DIAGNOSIS — R05 Cough: Secondary | ICD-10-CM | POA: Diagnosis not present

## 2019-12-16 ENCOUNTER — Ambulatory Visit: Admit: 2019-12-16 | Payer: PPO | Admitting: Thoracic Surgery (Cardiothoracic Vascular Surgery)

## 2019-12-16 SURGERY — EGD (ESOPHAGOGASTRODUODENOSCOPY)
Anesthesia: General

## 2019-12-23 ENCOUNTER — Other Ambulatory Visit: Payer: Self-pay

## 2019-12-23 ENCOUNTER — Ambulatory Visit (HOSPITAL_COMMUNITY)
Admission: RE | Admit: 2019-12-23 | Discharge: 2019-12-23 | Disposition: A | Discharge status: Home / Self Care | Payer: PPO | Source: Ambulatory Visit | Attending: Internal Medicine | Admitting: Internal Medicine

## 2019-12-23 DIAGNOSIS — Z1231 Encounter for screening mammogram for malignant neoplasm of breast: Secondary | ICD-10-CM | POA: Diagnosis not present

## 2019-12-26 DIAGNOSIS — R490 Dysphonia: Secondary | ICD-10-CM | POA: Diagnosis not present

## 2019-12-26 DIAGNOSIS — J3801 Paralysis of vocal cords and larynx, unilateral: Secondary | ICD-10-CM | POA: Diagnosis not present

## 2019-12-26 DIAGNOSIS — Z9889 Other specified postprocedural states: Secondary | ICD-10-CM | POA: Diagnosis not present

## 2019-12-26 DIAGNOSIS — E041 Nontoxic single thyroid nodule: Secondary | ICD-10-CM | POA: Diagnosis not present

## 2019-12-26 DIAGNOSIS — E049 Nontoxic goiter, unspecified: Secondary | ICD-10-CM | POA: Diagnosis not present

## 2020-01-27 DIAGNOSIS — D649 Anemia, unspecified: Secondary | ICD-10-CM | POA: Diagnosis not present

## 2020-01-27 DIAGNOSIS — Z9114 Patient's other noncompliance with medication regimen: Secondary | ICD-10-CM | POA: Diagnosis not present

## 2020-01-27 DIAGNOSIS — Z79899 Other long term (current) drug therapy: Secondary | ICD-10-CM | POA: Diagnosis not present

## 2020-01-27 DIAGNOSIS — J3801 Paralysis of vocal cords and larynx, unilateral: Secondary | ICD-10-CM | POA: Diagnosis not present

## 2020-01-27 DIAGNOSIS — I871 Compression of vein: Secondary | ICD-10-CM | POA: Diagnosis not present

## 2020-01-27 DIAGNOSIS — Z7982 Long term (current) use of aspirin: Secondary | ICD-10-CM | POA: Diagnosis not present

## 2020-01-27 DIAGNOSIS — Z01818 Encounter for other preprocedural examination: Secondary | ICD-10-CM | POA: Diagnosis not present

## 2020-01-27 DIAGNOSIS — J398 Other specified diseases of upper respiratory tract: Secondary | ICD-10-CM | POA: Diagnosis not present

## 2020-01-27 DIAGNOSIS — E042 Nontoxic multinodular goiter: Secondary | ICD-10-CM | POA: Diagnosis not present

## 2020-01-27 DIAGNOSIS — M5136 Other intervertebral disc degeneration, lumbar region: Secondary | ICD-10-CM | POA: Diagnosis not present

## 2020-01-27 DIAGNOSIS — E785 Hyperlipidemia, unspecified: Secondary | ICD-10-CM | POA: Diagnosis not present

## 2020-01-27 DIAGNOSIS — Z20822 Contact with and (suspected) exposure to covid-19: Secondary | ICD-10-CM | POA: Diagnosis not present

## 2020-01-27 DIAGNOSIS — K5730 Diverticulosis of large intestine without perforation or abscess without bleeding: Secondary | ICD-10-CM | POA: Diagnosis not present

## 2020-01-27 DIAGNOSIS — I10 Essential (primary) hypertension: Secondary | ICD-10-CM | POA: Diagnosis not present

## 2020-01-27 DIAGNOSIS — J029 Acute pharyngitis, unspecified: Secondary | ICD-10-CM | POA: Diagnosis not present

## 2020-01-27 DIAGNOSIS — G839 Paralytic syndrome, unspecified: Secondary | ICD-10-CM | POA: Diagnosis not present

## 2020-01-27 DIAGNOSIS — E049 Nontoxic goiter, unspecified: Secondary | ICD-10-CM | POA: Diagnosis not present

## 2020-01-27 DIAGNOSIS — R7303 Prediabetes: Secondary | ICD-10-CM | POA: Diagnosis not present

## 2020-01-27 DIAGNOSIS — R011 Cardiac murmur, unspecified: Secondary | ICD-10-CM | POA: Diagnosis not present

## 2020-01-28 DIAGNOSIS — E049 Nontoxic goiter, unspecified: Secondary | ICD-10-CM | POA: Diagnosis not present

## 2020-02-07 DIAGNOSIS — E049 Nontoxic goiter, unspecified: Secondary | ICD-10-CM | POA: Diagnosis not present

## 2020-02-19 DIAGNOSIS — Z23 Encounter for immunization: Secondary | ICD-10-CM | POA: Diagnosis not present

## 2020-05-18 DIAGNOSIS — E049 Nontoxic goiter, unspecified: Secondary | ICD-10-CM | POA: Diagnosis not present

## 2020-07-09 DIAGNOSIS — L723 Sebaceous cyst: Secondary | ICD-10-CM | POA: Diagnosis not present

## 2020-07-09 DIAGNOSIS — Z23 Encounter for immunization: Secondary | ICD-10-CM | POA: Diagnosis not present

## 2020-07-09 DIAGNOSIS — E1129 Type 2 diabetes mellitus with other diabetic kidney complication: Secondary | ICD-10-CM | POA: Diagnosis not present

## 2020-07-09 DIAGNOSIS — I10 Essential (primary) hypertension: Secondary | ICD-10-CM | POA: Diagnosis not present

## 2020-07-09 DIAGNOSIS — E039 Hypothyroidism, unspecified: Secondary | ICD-10-CM | POA: Diagnosis not present

## 2020-07-09 DIAGNOSIS — Z79899 Other long term (current) drug therapy: Secondary | ICD-10-CM | POA: Diagnosis not present

## 2020-08-30 IMAGING — MG DIGITAL SCREENING BILAT W/ TOMO W/ CAD
8 series · 8 of 24 positions shown · non-contrast
Comparison: Previous exam(s).

CLINICAL DATA: Screening.

EXAM:
DIGITAL SCREENING BILATERAL MAMMOGRAM WITH TOMO AND CAD

[R MLO synth-2D]
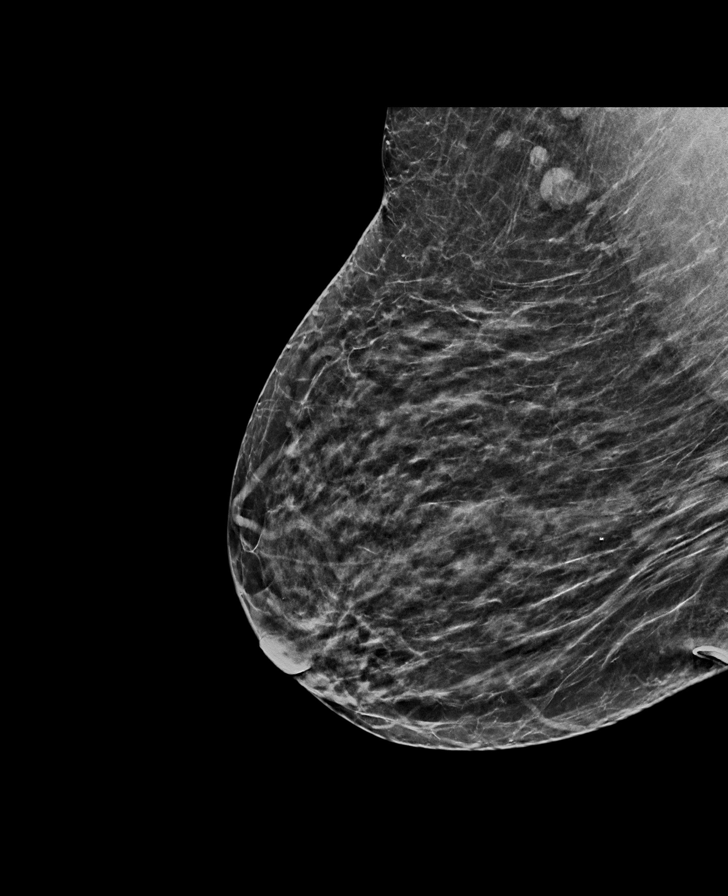

[R CC synth-2D]
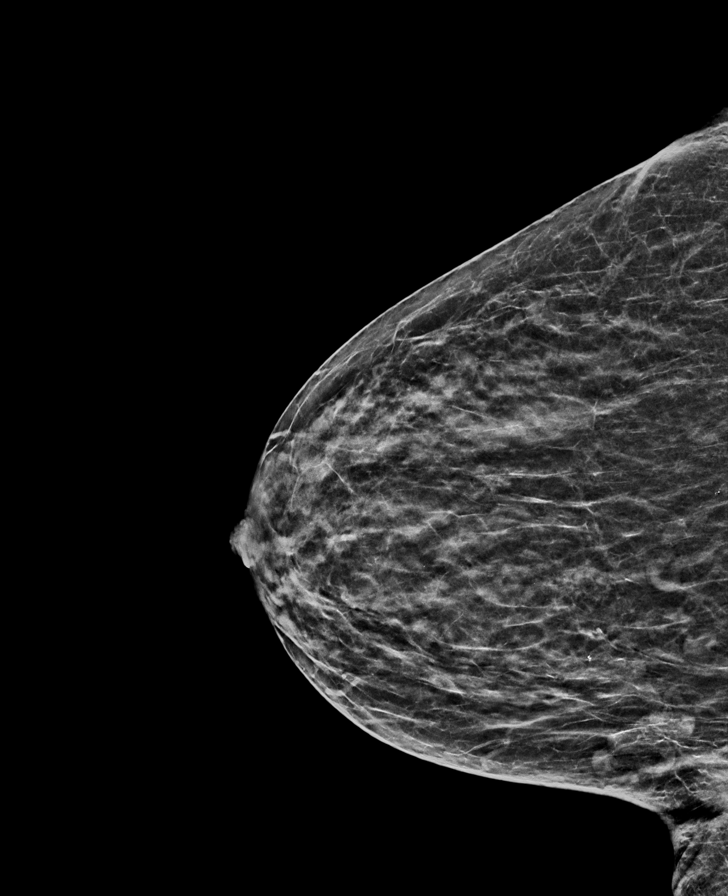

[L MLO synth-2D]
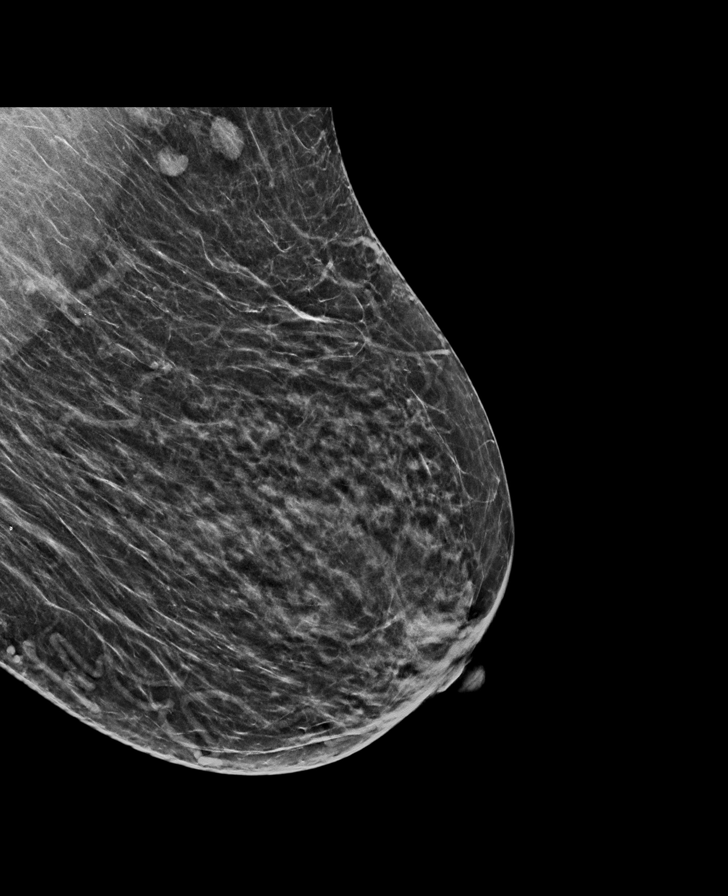

[L CC synth-2D]
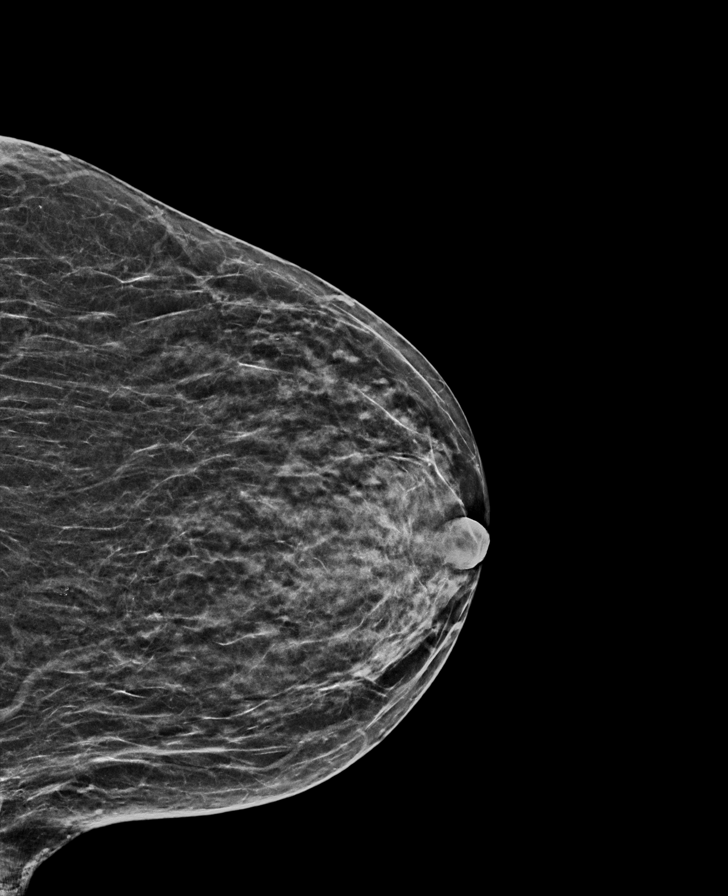

[R MLO tomo · tomo slice 27/54.0]
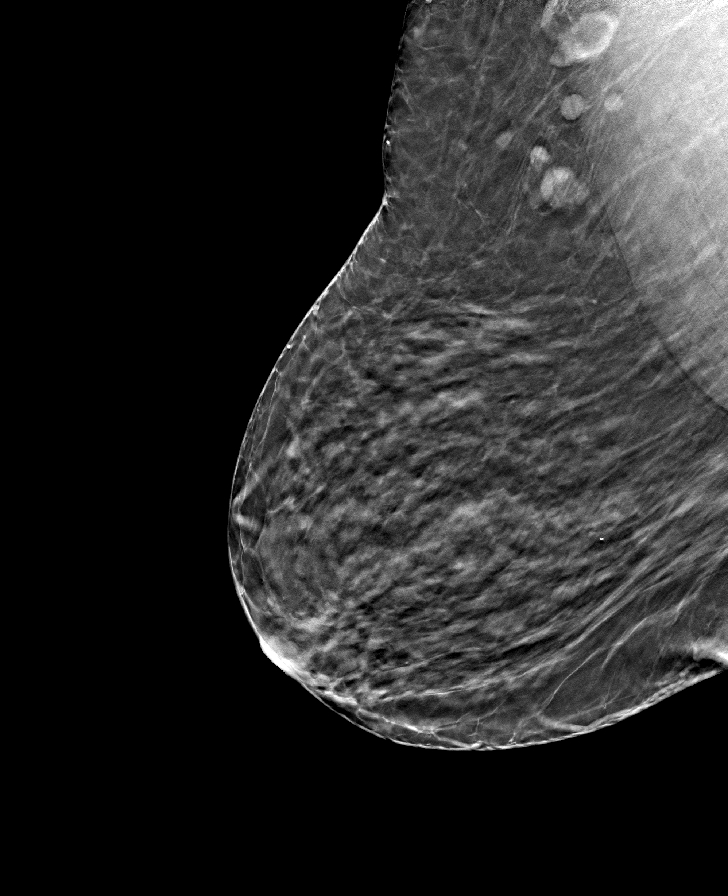

[L CC tomo · tomo slice 26/51.0]
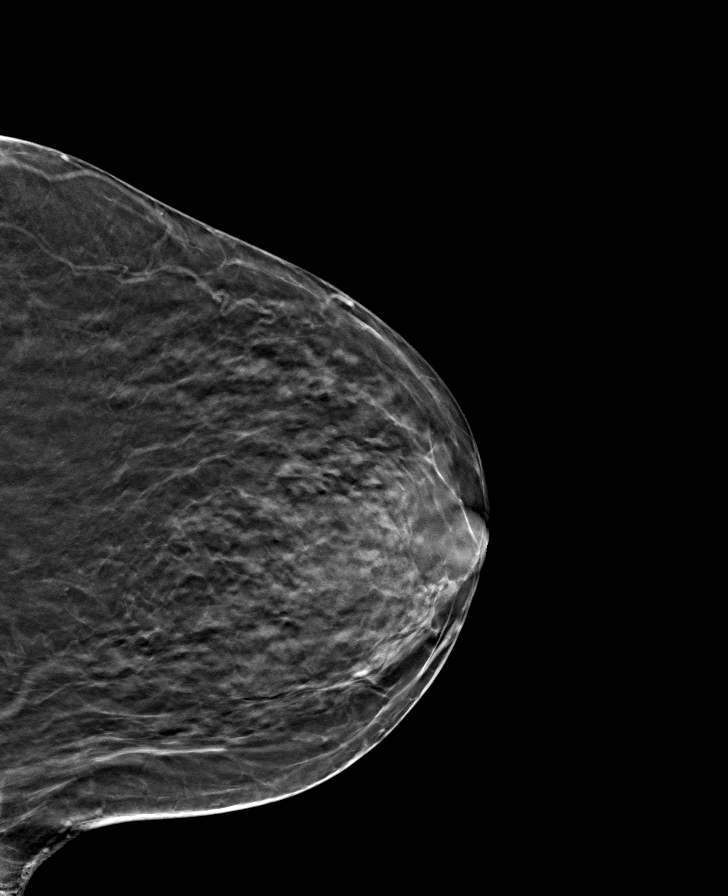

[R CC tomo · tomo slice 25/49.0]
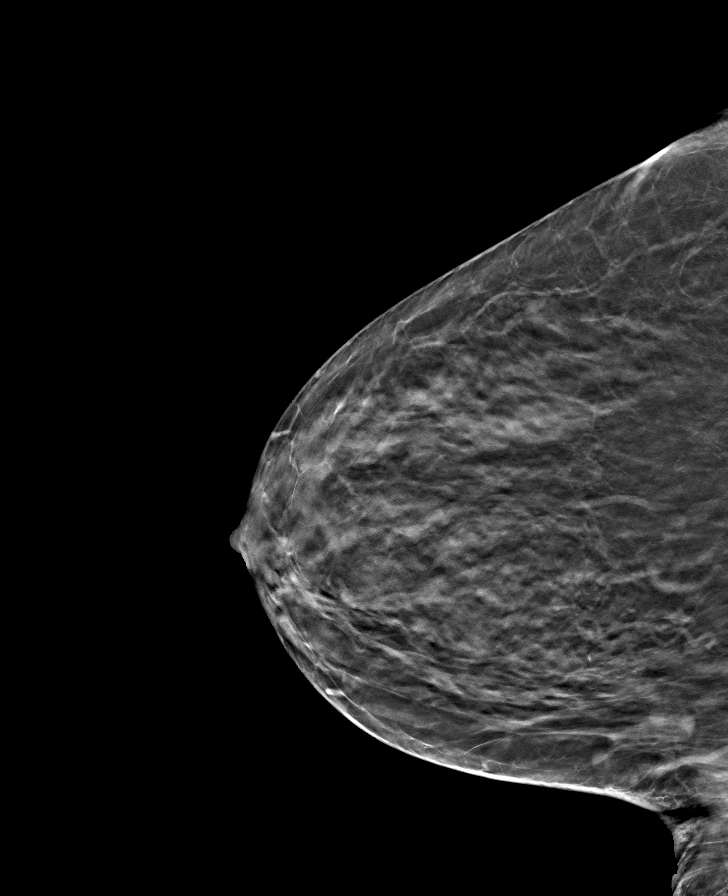

[L MLO tomo · tomo slice 27/54.0]
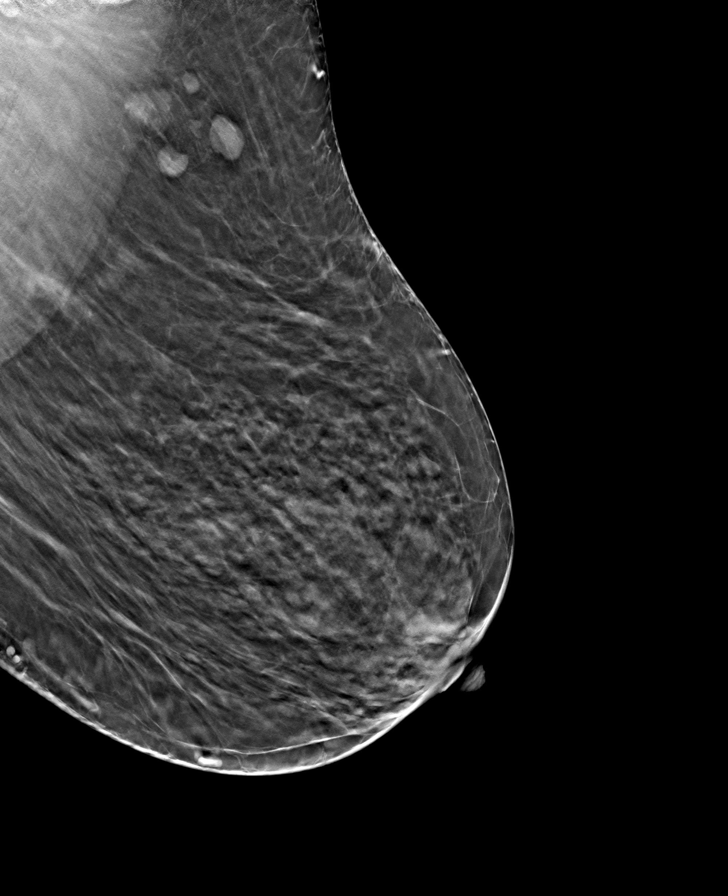

[8 of 24 positions shown; findings below may reference images not displayed]

ACR Breast Density Category c: The breast tissue is heterogeneously
dense, which may obscure small masses.
FINDINGS: There are no findings suspicious for malignancy. Images were
processed with CAD.
IMPRESSION: No mammographic evidence of malignancy. A result letter of this
screening mammogram will be mailed directly to the patient.

RECOMMENDATION:
Screening mammogram in one year. (Code:[id number])

BI-RADS CATEGORY  1: Negative.
